# Patient Record
Sex: Male | Born: 1960
Health system: Southern US, Community
[De-identification: ages and names within clinical notes are randomized; demographics above are authoritative.]

## PROBLEM LIST (undated history)

## (undated) DIAGNOSIS — K219 Gastro-esophageal reflux disease without esophagitis: Secondary | ICD-10-CM

## (undated) DIAGNOSIS — K2270 Barrett's esophagus without dysplasia: Secondary | ICD-10-CM

## (undated) DIAGNOSIS — M1990 Unspecified osteoarthritis, unspecified site: Secondary | ICD-10-CM

## (undated) DIAGNOSIS — G629 Polyneuropathy, unspecified: Secondary | ICD-10-CM

## (undated) DIAGNOSIS — R4020 Unspecified coma: Secondary | ICD-10-CM

## (undated) DIAGNOSIS — G8929 Other chronic pain: Secondary | ICD-10-CM

## (undated) DIAGNOSIS — I10 Essential (primary) hypertension: Secondary | ICD-10-CM

## (undated) DIAGNOSIS — T17908A Unspecified foreign body in respiratory tract, part unspecified causing other injury, initial encounter: Secondary | ICD-10-CM

## (undated) DIAGNOSIS — M549 Dorsalgia, unspecified: Secondary | ICD-10-CM

## (undated) DIAGNOSIS — Z9889 Other specified postprocedural states: Secondary | ICD-10-CM

## (undated) HISTORY — PX: BACK SURGERY: SHX140

## (undated) HISTORY — PX: ESOPHAGOGASTRODUODENOSCOPY: SHX1529

## (undated) HISTORY — PX: HIP SURGERY: SHX245

## (undated) HISTORY — PX: TOTAL HIP ARTHROPLASTY: SHX124

---

## 2004-07-11 HISTORY — PX: HERNIA REPAIR: SHX51

## 2010-07-11 HISTORY — PX: HERNIA REPAIR: SHX51

## 2016-09-12 DIAGNOSIS — G8929 Other chronic pain: Secondary | ICD-10-CM | POA: Diagnosis not present

## 2016-09-12 DIAGNOSIS — M5441 Lumbago with sciatica, right side: Secondary | ICD-10-CM | POA: Diagnosis not present

## 2016-09-12 DIAGNOSIS — M961 Postlaminectomy syndrome, not elsewhere classified: Secondary | ICD-10-CM | POA: Diagnosis not present

## 2016-09-12 DIAGNOSIS — Z5181 Encounter for therapeutic drug level monitoring: Secondary | ICD-10-CM | POA: Diagnosis not present

## 2016-12-11 DIAGNOSIS — S3993XA Unspecified injury of pelvis, initial encounter: Secondary | ICD-10-CM | POA: Diagnosis not present

## 2016-12-11 DIAGNOSIS — S32010A Wedge compression fracture of first lumbar vertebra, initial encounter for closed fracture: Secondary | ICD-10-CM | POA: Diagnosis not present

## 2016-12-11 DIAGNOSIS — M545 Low back pain: Secondary | ICD-10-CM | POA: Diagnosis not present

## 2016-12-11 DIAGNOSIS — S0990XA Unspecified injury of head, initial encounter: Secondary | ICD-10-CM | POA: Diagnosis not present

## 2016-12-11 DIAGNOSIS — S3992XA Unspecified injury of lower back, initial encounter: Secondary | ICD-10-CM | POA: Diagnosis not present

## 2016-12-11 DIAGNOSIS — R102 Pelvic and perineal pain: Secondary | ICD-10-CM | POA: Diagnosis not present

## 2016-12-11 DIAGNOSIS — M542 Cervicalgia: Secondary | ICD-10-CM | POA: Diagnosis not present

## 2016-12-12 DIAGNOSIS — Z5181 Encounter for therapeutic drug level monitoring: Secondary | ICD-10-CM | POA: Diagnosis not present

## 2016-12-12 DIAGNOSIS — S32000S Wedge compression fracture of unspecified lumbar vertebra, sequela: Secondary | ICD-10-CM | POA: Diagnosis not present

## 2016-12-12 DIAGNOSIS — Z79899 Other long term (current) drug therapy: Secondary | ICD-10-CM | POA: Diagnosis not present

## 2016-12-13 DIAGNOSIS — M545 Low back pain: Secondary | ICD-10-CM | POA: Diagnosis not present

## 2016-12-13 DIAGNOSIS — G8929 Other chronic pain: Secondary | ICD-10-CM | POA: Diagnosis not present

## 2016-12-13 DIAGNOSIS — S32010G Wedge compression fracture of first lumbar vertebra, subsequent encounter for fracture with delayed healing: Secondary | ICD-10-CM | POA: Diagnosis not present

## 2016-12-13 DIAGNOSIS — S32010A Wedge compression fracture of first lumbar vertebra, initial encounter for closed fracture: Secondary | ICD-10-CM | POA: Diagnosis not present

## 2016-12-13 DIAGNOSIS — F17200 Nicotine dependence, unspecified, uncomplicated: Secondary | ICD-10-CM | POA: Diagnosis not present

## 2016-12-14 DIAGNOSIS — S32010A Wedge compression fracture of first lumbar vertebra, initial encounter for closed fracture: Secondary | ICD-10-CM | POA: Diagnosis not present

## 2016-12-14 DIAGNOSIS — M5441 Lumbago with sciatica, right side: Secondary | ICD-10-CM | POA: Diagnosis not present

## 2016-12-14 DIAGNOSIS — M5442 Lumbago with sciatica, left side: Secondary | ICD-10-CM | POA: Diagnosis not present

## 2016-12-14 DIAGNOSIS — G8929 Other chronic pain: Secondary | ICD-10-CM | POA: Diagnosis not present

## 2016-12-21 DIAGNOSIS — S32011A Stable burst fracture of first lumbar vertebra, initial encounter for closed fracture: Secondary | ICD-10-CM | POA: Diagnosis not present

## 2016-12-21 DIAGNOSIS — M545 Low back pain: Secondary | ICD-10-CM | POA: Diagnosis not present

## 2016-12-21 DIAGNOSIS — X58XXXA Exposure to other specified factors, initial encounter: Secondary | ICD-10-CM | POA: Diagnosis not present

## 2016-12-21 DIAGNOSIS — M5136 Other intervertebral disc degeneration, lumbar region: Secondary | ICD-10-CM | POA: Diagnosis not present

## 2017-01-10 ENCOUNTER — Other Ambulatory Visit: Payer: Self-pay | Admitting: Neurosurgery

## 2017-01-10 ENCOUNTER — Encounter (HOSPITAL_COMMUNITY): Payer: Self-pay | Admitting: *Deleted

## 2017-01-10 DIAGNOSIS — Z6839 Body mass index (BMI) 39.0-39.9, adult: Secondary | ICD-10-CM | POA: Diagnosis not present

## 2017-01-10 DIAGNOSIS — S32010A Wedge compression fracture of first lumbar vertebra, initial encounter for closed fracture: Secondary | ICD-10-CM | POA: Diagnosis not present

## 2017-01-10 DIAGNOSIS — I10 Essential (primary) hypertension: Secondary | ICD-10-CM | POA: Diagnosis not present

## 2017-01-12 ENCOUNTER — Inpatient Hospital Stay (HOSPITAL_COMMUNITY): Admission: RE | Admit: 2017-01-12 | Payer: Medicare Other | Source: Ambulatory Visit | Admitting: Neurosurgery

## 2017-01-12 ENCOUNTER — Encounter (HOSPITAL_COMMUNITY): Payer: Self-pay | Admitting: Certified Registered"

## 2017-01-12 HISTORY — DX: Unspecified osteoarthritis, unspecified site: M19.90

## 2017-01-12 HISTORY — DX: Gastro-esophageal reflux disease without esophagitis: K21.9

## 2017-01-12 HISTORY — DX: Polyneuropathy, unspecified: G62.9

## 2017-01-12 HISTORY — DX: Unspecified coma: R40.20

## 2017-01-12 HISTORY — DX: Barrett's esophagus without dysplasia: K22.70

## 2017-01-12 HISTORY — DX: Unspecified foreign body in respiratory tract, part unspecified causing other injury, initial encounter: T17.908A

## 2017-01-17 ENCOUNTER — Encounter (HOSPITAL_COMMUNITY): Payer: Self-pay | Admitting: *Deleted

## 2017-01-17 ENCOUNTER — Other Ambulatory Visit: Payer: Self-pay | Admitting: Neurosurgery

## 2017-01-17 NOTE — Progress Notes (Signed)
Spoke with pt's wife, Tyler AasDoris for pre-op call. Pt requested that I talk with her. She denies any cardiac history, chest pain or sob. Does have hx of HTN, but has not been on medications for a while. She states pt is not a diabetic.

## 2017-01-18 ENCOUNTER — Inpatient Hospital Stay (HOSPITAL_COMMUNITY): Payer: Medicare Other | Admitting: Certified Registered Nurse Anesthetist

## 2017-01-18 ENCOUNTER — Encounter (HOSPITAL_COMMUNITY): Payer: Self-pay | Admitting: *Deleted

## 2017-01-18 ENCOUNTER — Inpatient Hospital Stay (HOSPITAL_COMMUNITY)
Admission: RE | Disposition: A | Discharge status: Home Health | Payer: Self-pay | Source: Ambulatory Visit | Attending: Neurosurgery

## 2017-01-18 ENCOUNTER — Inpatient Hospital Stay (HOSPITAL_COMMUNITY): Payer: Medicare Other

## 2017-01-18 ENCOUNTER — Inpatient Hospital Stay (HOSPITAL_COMMUNITY)
Admission: RE | Admit: 2017-01-18 | Discharge: 2017-01-21 | DRG: 460 | Disposition: A | Discharge status: Home Health | Payer: Medicare Other | Source: Ambulatory Visit | Attending: Neurosurgery | Admitting: Neurosurgery

## 2017-01-18 DIAGNOSIS — Z8051 Family history of malignant neoplasm of kidney: Secondary | ICD-10-CM

## 2017-01-18 DIAGNOSIS — S32010A Wedge compression fracture of first lumbar vertebra, initial encounter for closed fracture: Secondary | ICD-10-CM | POA: Diagnosis present

## 2017-01-18 DIAGNOSIS — Z7982 Long term (current) use of aspirin: Secondary | ICD-10-CM

## 2017-01-18 DIAGNOSIS — Z8249 Family history of ischemic heart disease and other diseases of the circulatory system: Secondary | ICD-10-CM | POA: Diagnosis not present

## 2017-01-18 DIAGNOSIS — Z96643 Presence of artificial hip joint, bilateral: Secondary | ICD-10-CM | POA: Diagnosis present

## 2017-01-18 DIAGNOSIS — I10 Essential (primary) hypertension: Secondary | ICD-10-CM | POA: Diagnosis present

## 2017-01-18 DIAGNOSIS — Z6839 Body mass index (BMI) 39.0-39.9, adult: Secondary | ICD-10-CM | POA: Diagnosis not present

## 2017-01-18 DIAGNOSIS — F17200 Nicotine dependence, unspecified, uncomplicated: Secondary | ICD-10-CM | POA: Diagnosis not present

## 2017-01-18 DIAGNOSIS — Z419 Encounter for procedure for purposes other than remedying health state, unspecified: Secondary | ICD-10-CM

## 2017-01-18 DIAGNOSIS — S32018A Other fracture of first lumbar vertebra, initial encounter for closed fracture: Secondary | ICD-10-CM | POA: Diagnosis not present

## 2017-01-18 DIAGNOSIS — E669 Obesity, unspecified: Secondary | ICD-10-CM | POA: Diagnosis not present

## 2017-01-18 DIAGNOSIS — M4326 Fusion of spine, lumbar region: Secondary | ICD-10-CM | POA: Diagnosis not present

## 2017-01-18 DIAGNOSIS — G8929 Other chronic pain: Secondary | ICD-10-CM | POA: Diagnosis present

## 2017-01-18 DIAGNOSIS — Z833 Family history of diabetes mellitus: Secondary | ICD-10-CM

## 2017-01-18 DIAGNOSIS — Z880 Allergy status to penicillin: Secondary | ICD-10-CM

## 2017-01-18 DIAGNOSIS — M1990 Unspecified osteoarthritis, unspecified site: Secondary | ICD-10-CM | POA: Diagnosis not present

## 2017-01-18 DIAGNOSIS — Z79899 Other long term (current) drug therapy: Secondary | ICD-10-CM | POA: Diagnosis not present

## 2017-01-18 DIAGNOSIS — K219 Gastro-esophageal reflux disease without esophagitis: Secondary | ICD-10-CM | POA: Diagnosis not present

## 2017-01-18 DIAGNOSIS — S32019A Unspecified fracture of first lumbar vertebra, initial encounter for closed fracture: Secondary | ICD-10-CM | POA: Diagnosis not present

## 2017-01-18 HISTORY — DX: Dorsalgia, unspecified: M54.9

## 2017-01-18 HISTORY — DX: Other chronic pain: G89.29

## 2017-01-18 HISTORY — PX: POSTERIOR LUMBAR FUSION 4 LEVEL: SHX6037

## 2017-01-18 HISTORY — DX: Other specified postprocedural states: Z98.890

## 2017-01-18 HISTORY — DX: Essential (primary) hypertension: I10

## 2017-01-18 LAB — BASIC METABOLIC PANEL
Anion gap: 8 (ref 5–15)
BUN: 15 mg/dL (ref 6–20)
CALCIUM: 9.2 mg/dL (ref 8.9–10.3)
CO2: 22 mmol/L (ref 22–32)
CREATININE: 0.62 mg/dL (ref 0.61–1.24)
Chloride: 106 mmol/L (ref 101–111)
GFR calc Af Amer: >60 mL/min (ref >60)
GLUCOSE: 106 mg/dL — AB (ref 65–99)
POTASSIUM: 3.8 mmol/L (ref 3.5–5.1)
SODIUM: 136 mmol/L (ref 135–145)

## 2017-01-18 LAB — CBC
HCT: 47.7 % (ref 39.0–52.0)
Hemoglobin: 15.8 g/dL (ref 13.0–17.0)
MCH: 29.9 pg (ref 26.0–34.0)
MCHC: 33.1 g/dL (ref 30.0–36.0)
MCV: 90.3 fL (ref 78.0–100.0)
PLATELETS: 156 10*3/uL (ref 150–400)
RBC: 5.28 MIL/uL (ref 4.22–5.81)
RDW: 13.8 % (ref 11.5–15.5)
WBC: 8.2 10*3/uL (ref 4.0–10.5)

## 2017-01-18 LAB — SURGICAL PCR SCREEN
MRSA, PCR: NEGATIVE
Staphylococcus aureus: NEGATIVE

## 2017-01-18 LAB — TYPE AND SCREEN
ABO/RH(D): O POS
ANTIBODY SCREEN: NEGATIVE

## 2017-01-18 LAB — ABO/RH: ABO/RH(D): O POS

## 2017-01-18 SURGERY — POSTERIOR LUMBAR FUSION 4 LEVEL
Anesthesia: General

## 2017-01-18 SURGERY — POSTERIOR LUMBAR FUSION 4 LEVEL
Anesthesia: General | Site: Back

## 2017-01-18 MED ORDER — HYDROMORPHONE HCL 1 MG/ML IJ SOLN
INTRAMUSCULAR | Status: AC
  Filled 2017-01-18: qty 0.5

## 2017-01-18 MED ORDER — BUPIVACAINE HCL (PF) 0.25 % IJ SOLN
INTRAMUSCULAR | Status: AC
  Filled 2017-01-18: qty 30

## 2017-01-18 MED ORDER — LACTATED RINGERS IV SOLN
INTRAVENOUS | Status: DC
  Administered 2017-01-18 (×3): via INTRAVENOUS

## 2017-01-18 MED ORDER — SODIUM CHLORIDE 0.9% FLUSH
3.0000 mL | Freq: Two times a day (BID) | INTRAVENOUS | Status: DC
  Administered 2017-01-19 – 2017-01-21 (×5): 3 mL via INTRAVENOUS

## 2017-01-18 MED ORDER — FENTANYL CITRATE (PF) 100 MCG/2ML IJ SOLN
50.0000 ug | Freq: Once | INTRAMUSCULAR | Status: AC
  Administered 2017-01-18: 50 ug via INTRAVENOUS

## 2017-01-18 MED ORDER — KETOROLAC TROMETHAMINE 15 MG/ML IJ SOLN
15.0000 mg | Freq: Four times a day (QID) | INTRAMUSCULAR | Status: AC
  Administered 2017-01-18 – 2017-01-19 (×4): 15 mg via INTRAVENOUS
  Filled 2017-01-18 (×4): qty 1

## 2017-01-18 MED ORDER — HYDROMORPHONE HCL 1 MG/ML IJ SOLN
INTRAMUSCULAR | Status: AC
  Administered 2017-01-18: 0.5 mg via INTRAVENOUS
  Filled 2017-01-18: qty 1

## 2017-01-18 MED ORDER — GABAPENTIN 300 MG PO CAPS: 300.0000 mg | ORAL_CAPSULE | Freq: Three times a day (TID) | ORAL | Status: DC

## 2017-01-18 MED ORDER — SENNA 8.6 MG PO TABS
1.0000 | ORAL_TABLET | Freq: Two times a day (BID) | ORAL | Status: DC
  Administered 2017-01-18 – 2017-01-21 (×7): 8.6 mg via ORAL
  Filled 2017-01-18 (×7): qty 1

## 2017-01-18 MED ORDER — ONDANSETRON HCL 4 MG/2ML IJ SOLN
INTRAMUSCULAR | Status: AC
  Filled 2017-01-18: qty 2

## 2017-01-18 MED ORDER — HYDROMORPHONE HCL 1 MG/ML IJ SOLN
INTRAMUSCULAR | Status: DC | PRN
  Administered 2017-01-18 (×2): 0.5 mg via INTRAVENOUS

## 2017-01-18 MED ORDER — ACETAMINOPHEN 650 MG RE SUPP: 650.0000 mg | RECTAL | Status: DC | PRN

## 2017-01-18 MED ORDER — ADULT MULTIVITAMIN W/MINERALS CH
1.0000 | ORAL_TABLET | Freq: Every day | ORAL | Status: DC
  Administered 2017-01-19 – 2017-01-21 (×3): 1 via ORAL
  Filled 2017-01-18 (×3): qty 1

## 2017-01-18 MED ORDER — ONDANSETRON HCL 4 MG PO TABS: 4.0000 mg | ORAL_TABLET | Freq: Four times a day (QID) | ORAL | Status: DC | PRN

## 2017-01-18 MED ORDER — THROMBIN 20000 UNITS EX SOLR
CUTANEOUS | Status: DC | PRN
  Administered 2017-01-18: 18:00:00 via TOPICAL

## 2017-01-18 MED ORDER — MIDAZOLAM HCL 5 MG/5ML IJ SOLN
INTRAMUSCULAR | Status: DC | PRN
  Administered 2017-01-18: 2 mg via INTRAVENOUS

## 2017-01-18 MED ORDER — LIDOCAINE HCL (CARDIAC) 20 MG/ML IV SOLN
INTRAVENOUS | Status: AC
  Filled 2017-01-18: qty 5

## 2017-01-18 MED ORDER — LIDOCAINE 2% (20 MG/ML) 5 ML SYRINGE
INTRAMUSCULAR | Status: DC | PRN
  Administered 2017-01-18: 100 mg via INTRAVENOUS

## 2017-01-18 MED ORDER — GABAPENTIN 600 MG PO TABS
1200.0000 mg | ORAL_TABLET | Freq: Every day | ORAL | Status: DC
  Administered 2017-01-18 – 2017-01-21 (×4): 1200 mg via ORAL
  Filled 2017-01-18 (×4): qty 2

## 2017-01-18 MED ORDER — HYDROMORPHONE HCL 1 MG/ML IJ SOLN
1.0000 mg | INTRAMUSCULAR | Status: DC | PRN
  Administered 2017-01-19 – 2017-01-20 (×7): 1 mg via INTRAVENOUS
  Filled 2017-01-18 (×7): qty 1

## 2017-01-18 MED ORDER — LIDOCAINE-EPINEPHRINE 0.5 %-1:200000 IJ SOLN
INTRAMUSCULAR | Status: DC | PRN
  Administered 2017-01-18: 20 mL

## 2017-01-18 MED ORDER — FENTANYL CITRATE (PF) 250 MCG/5ML IJ SOLN
INTRAMUSCULAR | Status: AC
  Filled 2017-01-18: qty 5

## 2017-01-18 MED ORDER — GABAPENTIN 600 MG PO TABS
600.0000 mg | ORAL_TABLET | Freq: Two times a day (BID) | ORAL | Status: DC
  Administered 2017-01-19 – 2017-01-21 (×6): 600 mg via ORAL
  Filled 2017-01-18 (×6): qty 1

## 2017-01-18 MED ORDER — SENNOSIDES-DOCUSATE SODIUM 8.6-50 MG PO TABS: 1.0000 | ORAL_TABLET | Freq: Every evening | ORAL | Status: DC | PRN

## 2017-01-18 MED ORDER — CEFAZOLIN SODIUM-DEXTROSE 2-4 GM/100ML-% IV SOLN
INTRAVENOUS | Status: AC
  Filled 2017-01-18: qty 100

## 2017-01-18 MED ORDER — SODIUM CHLORIDE 0.9% FLUSH
3.0000 mL | INTRAVENOUS | Status: DC | PRN
Start: 2017-01-18 — End: 2017-01-22

## 2017-01-18 MED ORDER — OXYCODONE HCL ER 20 MG PO T12A
20.0000 mg | EXTENDED_RELEASE_TABLET | Freq: Two times a day (BID) | ORAL | Status: DC
  Administered 2017-01-18 – 2017-01-19 (×2): 20 mg via ORAL
  Filled 2017-01-18 (×2): qty 1

## 2017-01-18 MED ORDER — OMEGA-3-ACID ETHYL ESTERS 1 G PO CAPS
1.0000 g | ORAL_CAPSULE | Freq: Every day | ORAL | Status: DC
  Administered 2017-01-19 – 2017-01-21 (×3): 1 g via ORAL
  Filled 2017-01-18 (×3): qty 1

## 2017-01-18 MED ORDER — ACETAMINOPHEN 325 MG PO TABS
650.0000 mg | ORAL_TABLET | ORAL | Status: DC | PRN
  Administered 2017-01-20: 650 mg via ORAL
  Filled 2017-01-18: qty 2

## 2017-01-18 MED ORDER — PROPOFOL 10 MG/ML IV BOLUS
INTRAVENOUS | Status: AC
Start: 2017-01-18 — End: 2017-01-18
  Filled 2017-01-18: qty 20

## 2017-01-18 MED ORDER — BUPIVACAINE HCL (PF) 0.25 % IJ SOLN
INTRAMUSCULAR | Status: DC | PRN
  Administered 2017-01-18: 30 mL

## 2017-01-18 MED ORDER — OXYCODONE HCL 5 MG PO TABS: 5.0000 mg | ORAL_TABLET | Freq: Once | ORAL | Status: DC | PRN

## 2017-01-18 MED ORDER — HYDROMORPHONE HCL 1 MG/ML IJ SOLN: 0.2500 mg | INTRAMUSCULAR | Status: DC | PRN

## 2017-01-18 MED ORDER — DEXAMETHASONE SODIUM PHOSPHATE 10 MG/ML IJ SOLN
INTRAMUSCULAR | Status: AC
  Filled 2017-01-18: qty 1

## 2017-01-18 MED ORDER — PHENOL 1.4 % MT LIQD: 1.0000 | OROMUCOSAL | Status: DC | PRN

## 2017-01-18 MED ORDER — GABAPENTIN 600 MG PO TABS: 600.0000 mg | ORAL_TABLET | Freq: Three times a day (TID) | ORAL | Status: DC

## 2017-01-18 MED ORDER — ZOLPIDEM TARTRATE 5 MG PO TABS
5.0000 mg | ORAL_TABLET | Freq: Every evening | ORAL | Status: DC | PRN
  Administered 2017-01-19 – 2017-01-21 (×3): 5 mg via ORAL
  Filled 2017-01-18 (×3): qty 1

## 2017-01-18 MED ORDER — BISACODYL 5 MG PO TBEC: 5.0000 mg | DELAYED_RELEASE_TABLET | Freq: Every day | ORAL | Status: DC | PRN

## 2017-01-18 MED ORDER — HYDROMORPHONE HCL 1 MG/ML IJ SOLN
INTRAMUSCULAR | Status: AC
  Administered 2017-01-18: 0.5 mg via INTRAVENOUS
  Filled 2017-01-18: qty 0.5

## 2017-01-18 MED ORDER — ONDANSETRON HCL 4 MG/2ML IJ SOLN
INTRAMUSCULAR | Status: DC | PRN
  Administered 2017-01-18 (×2): 4 mg via INTRAVENOUS

## 2017-01-18 MED ORDER — MAGNESIUM CITRATE PO SOLN: 1.0000 | Freq: Once | ORAL | Status: DC | PRN

## 2017-01-18 MED ORDER — ROCURONIUM BROMIDE 10 MG/ML (PF) SYRINGE
PREFILLED_SYRINGE | INTRAVENOUS | Status: DC | PRN
  Administered 2017-01-18: 50 mg via INTRAVENOUS

## 2017-01-18 MED ORDER — PHENYLEPHRINE HCL 10 MG/ML IJ SOLN
INTRAVENOUS | Status: DC | PRN
  Administered 2017-01-18: 25 ug/min via INTRAVENOUS

## 2017-01-18 MED ORDER — MIDAZOLAM HCL 2 MG/2ML IJ SOLN
INTRAMUSCULAR | Status: AC
  Filled 2017-01-18: qty 2

## 2017-01-18 MED ORDER — DIAZEPAM 5 MG PO TABS
5.0000 mg | ORAL_TABLET | Freq: Four times a day (QID) | ORAL | Status: DC | PRN
  Administered 2017-01-19 – 2017-01-21 (×6): 5 mg via ORAL
  Filled 2017-01-18 (×6): qty 1

## 2017-01-18 MED ORDER — ALUM & MAG HYDROXIDE-SIMETH 200-200-20 MG/5ML PO SUSP
30.0000 mL | Freq: Four times a day (QID) | ORAL | Status: DC | PRN
  Administered 2017-01-20: 30 mL via ORAL
  Filled 2017-01-18: qty 30

## 2017-01-18 MED ORDER — THROMBIN 20000 UNITS EX SOLR
CUTANEOUS | Status: AC
  Filled 2017-01-18: qty 20000

## 2017-01-18 MED ORDER — MUPIROCIN 2 % EX OINT
1.0000 "application " | TOPICAL_OINTMENT | Freq: Once | CUTANEOUS | Status: AC
  Administered 2017-01-18: 1 via TOPICAL
  Filled 2017-01-18: qty 22

## 2017-01-18 MED ORDER — SODIUM CHLORIDE 0.9 % IV SOLN: 250.0000 mL | INTRAVENOUS | Status: DC

## 2017-01-18 MED ORDER — MENTHOL 3 MG MT LOZG: 1.0000 | LOZENGE | OROMUCOSAL | Status: DC | PRN

## 2017-01-18 MED ORDER — FENTANYL CITRATE (PF) 100 MCG/2ML IJ SOLN
INTRAMUSCULAR | Status: AC
  Filled 2017-01-18: qty 2

## 2017-01-18 MED ORDER — SODIUM CHLORIDE 0.9 % IV SOLN
INTRAVENOUS | Status: DC | PRN
  Administered 2017-01-18: 19:00:00 via INTRAVENOUS

## 2017-01-18 MED ORDER — OXYCODONE HCL 5 MG/5ML PO SOLN
5.0000 mg | Freq: Once | ORAL | Status: DC | PRN
Start: 2017-01-18 — End: 2017-01-18

## 2017-01-18 MED ORDER — OXYCODONE HCL 5 MG PO TABS
5.0000 mg | ORAL_TABLET | ORAL | Status: DC | PRN
  Administered 2017-01-18 – 2017-01-21 (×11): 10 mg via ORAL
  Administered 2017-01-21: 5 mg via ORAL
  Administered 2017-01-21: 10 mg via ORAL
  Filled 2017-01-18 (×13): qty 2

## 2017-01-18 MED ORDER — SUGAMMADEX SODIUM 200 MG/2ML IV SOLN
INTRAVENOUS | Status: DC | PRN
  Administered 2017-01-18: 200 mg via INTRAVENOUS

## 2017-01-18 MED ORDER — PHENYLEPHRINE 40 MCG/ML (10ML) SYRINGE FOR IV PUSH (FOR BLOOD PRESSURE SUPPORT)
PREFILLED_SYRINGE | INTRAVENOUS | Status: DC | PRN
  Administered 2017-01-18 (×3): 80 ug via INTRAVENOUS

## 2017-01-18 MED ORDER — LIDOCAINE-EPINEPHRINE 0.5 %-1:200000 IJ SOLN
INTRAMUSCULAR | Status: AC
  Filled 2017-01-18: qty 1

## 2017-01-18 MED ORDER — ONDANSETRON HCL 4 MG/2ML IJ SOLN
4.0000 mg | Freq: Four times a day (QID) | INTRAMUSCULAR | Status: DC | PRN
Start: 2017-01-18 — End: 2017-01-22

## 2017-01-18 MED ORDER — ONDANSETRON HCL 4 MG/2ML IJ SOLN
4.0000 mg | Freq: Once | INTRAMUSCULAR | Status: DC | PRN
Start: 2017-01-18 — End: 2017-01-18

## 2017-01-18 MED ORDER — CEFAZOLIN SODIUM-DEXTROSE 2-3 GM-% IV SOLR
2.0000 g | Freq: Once | INTRAVENOUS | Status: AC
  Administered 2017-01-18 (×2): 2 g via INTRAVENOUS
  Filled 2017-01-18: qty 50

## 2017-01-18 MED ORDER — PANTOPRAZOLE SODIUM 40 MG PO TBEC
80.0000 mg | DELAYED_RELEASE_TABLET | Freq: Every day | ORAL | Status: DC
  Administered 2017-01-19 – 2017-01-21 (×3): 80 mg via ORAL
  Filled 2017-01-18 (×3): qty 2

## 2017-01-18 MED ORDER — VECURONIUM BROMIDE 10 MG IV SOLR
INTRAVENOUS | Status: AC
  Filled 2017-01-18: qty 10

## 2017-01-18 MED ORDER — SODIUM CHLORIDE 0.9 % IJ SOLN
INTRAMUSCULAR | Status: AC
  Filled 2017-01-18: qty 10

## 2017-01-18 MED ORDER — POTASSIUM CHLORIDE IN NACL 20-0.9 MEQ/L-% IV SOLN
INTRAVENOUS | Status: DC
  Administered 2017-01-18: 23:00:00 via INTRAVENOUS
  Filled 2017-01-18 (×2): qty 1000

## 2017-01-18 MED ORDER — PHENYLEPHRINE 40 MCG/ML (10ML) SYRINGE FOR IV PUSH (FOR BLOOD PRESSURE SUPPORT)
PREFILLED_SYRINGE | INTRAVENOUS | Status: AC
  Filled 2017-01-18: qty 10

## 2017-01-18 MED ORDER — PROPOFOL 10 MG/ML IV BOLUS
INTRAVENOUS | Status: DC | PRN
  Administered 2017-01-18: 180 mg via INTRAVENOUS

## 2017-01-18 MED ORDER — VECURONIUM BROMIDE 10 MG IV SOLR
INTRAVENOUS | Status: DC | PRN
  Administered 2017-01-18: 2 mg via INTRAVENOUS
  Administered 2017-01-18 (×2): 3 mg via INTRAVENOUS
  Administered 2017-01-18 (×2): 1 mg via INTRAVENOUS

## 2017-01-18 MED ORDER — FENTANYL CITRATE (PF) 100 MCG/2ML IJ SOLN
INTRAMUSCULAR | Status: DC | PRN
  Administered 2017-01-18: 50 ug via INTRAVENOUS
  Administered 2017-01-18: 100 ug via INTRAVENOUS
  Administered 2017-01-18: 50 ug via INTRAVENOUS
  Administered 2017-01-18 (×2): 100 ug via INTRAVENOUS
  Administered 2017-01-18 (×3): 50 ug via INTRAVENOUS
  Administered 2017-01-18: 100 ug via INTRAVENOUS
  Administered 2017-01-18 (×2): 50 ug via INTRAVENOUS

## 2017-01-18 MED ORDER — HYDROMORPHONE HCL 1 MG/ML IJ SOLN
0.2500 mg | INTRAMUSCULAR | Status: DC | PRN
  Administered 2017-01-18 (×4): 0.5 mg via INTRAVENOUS

## 2017-01-18 MED ORDER — ROCURONIUM BROMIDE 50 MG/5ML IV SOLN
INTRAVENOUS | Status: AC
  Filled 2017-01-18: qty 1

## 2017-01-18 MED ORDER — CEFAZOLIN SODIUM 1 G IJ SOLR
INTRAMUSCULAR | Status: AC
  Filled 2017-01-18: qty 20

## 2017-01-18 MED ORDER — 0.9 % SODIUM CHLORIDE (POUR BTL) OPTIME
TOPICAL | Status: DC | PRN
  Administered 2017-01-18: 1000 mL

## 2017-01-18 SURGICAL SUPPLY — 72 items
BAG DECANTER FOR FLEXI CONT (MISCELLANEOUS) IMPLANT
BENZOIN TINCTURE PRP APPL 2/3 (GAUZE/BANDAGES/DRESSINGS) IMPLANT
BLADE CLIPPER SURG (BLADE) IMPLANT
BUR MATCHSTICK NEURO 3.0 LAGG (BURR) ×2 IMPLANT
BUR PRECISION FLUTE 5.0 (BURR) ×2 IMPLANT
CANISTER SUCT 3000ML PPV (MISCELLANEOUS) ×2 IMPLANT
CARTRIDGE OIL MAESTRO DRILL (MISCELLANEOUS) ×1 IMPLANT
CONT SPEC 4OZ CLIKSEAL STRL BL (MISCELLANEOUS) ×2 IMPLANT
COVER BACK TABLE 60X90IN (DRAPES) IMPLANT
DECANTER SPIKE VIAL GLASS SM (MISCELLANEOUS) ×2 IMPLANT
DERMABOND ADVANCED (GAUZE/BANDAGES/DRESSINGS) ×3
DERMABOND ADVANCED .7 DNX12 (GAUZE/BANDAGES/DRESSINGS) ×3 IMPLANT
DIFFUSER DRILL AIR PNEUMATIC (MISCELLANEOUS) ×2 IMPLANT
DRAPE C-ARM 42X72 X-RAY (DRAPES) ×2 IMPLANT
DRAPE C-ARMOR (DRAPES) ×2 IMPLANT
DRAPE LAPAROTOMY 100X72X124 (DRAPES) ×2 IMPLANT
DRAPE POUCH INSTRU U-SHP 10X18 (DRAPES) ×2 IMPLANT
DRAPE SURG 17X23 STRL (DRAPES) ×2 IMPLANT
DRSG MEPILEX BORDER 4X12 (GAUZE/BANDAGES/DRESSINGS) ×2 IMPLANT
DURAPREP 26ML APPLICATOR (WOUND CARE) ×2 IMPLANT
ELECT BLADE 4.0 EZ CLEAN MEGAD (MISCELLANEOUS) ×2
ELECT REM PT RETURN 9FT ADLT (ELECTROSURGICAL) ×2
ELECTRODE BLDE 4.0 EZ CLN MEGD (MISCELLANEOUS) ×1 IMPLANT
ELECTRODE REM PT RTRN 9FT ADLT (ELECTROSURGICAL) ×1 IMPLANT
GAUZE SPONGE 4X4 12PLY STRL (GAUZE/BANDAGES/DRESSINGS) IMPLANT
GAUZE SPONGE 4X4 16PLY XRAY LF (GAUZE/BANDAGES/DRESSINGS) ×6 IMPLANT
GLOVE BIO SURGEON STRL SZ 6.5 (GLOVE) ×2 IMPLANT
GLOVE BIO SURGEON STRL SZ7 (GLOVE) ×2 IMPLANT
GLOVE BIOGEL PI IND STRL 7.5 (GLOVE) ×1 IMPLANT
GLOVE BIOGEL PI IND STRL 8 (GLOVE) ×1 IMPLANT
GLOVE BIOGEL PI INDICATOR 7.5 (GLOVE) ×1
GLOVE BIOGEL PI INDICATOR 8 (GLOVE) ×1
GLOVE ECLIPSE 6.5 STRL STRAW (GLOVE) ×4 IMPLANT
GLOVE ECLIPSE 7.5 STRL STRAW (GLOVE) ×2 IMPLANT
GLOVE EXAM NITRILE LRG STRL (GLOVE) IMPLANT
GLOVE EXAM NITRILE XL STR (GLOVE) IMPLANT
GLOVE EXAM NITRILE XS STR PU (GLOVE) IMPLANT
GLOVE SURG SS PI 7.0 STRL IVOR (GLOVE) ×6 IMPLANT
GLOVE SURG SS PI 7.5 STRL IVOR (GLOVE) ×2 IMPLANT
GOWN STRL REUS W/ TWL LRG LVL3 (GOWN DISPOSABLE) ×5 IMPLANT
GOWN STRL REUS W/ TWL XL LVL3 (GOWN DISPOSABLE) ×1 IMPLANT
GOWN STRL REUS W/TWL 2XL LVL3 (GOWN DISPOSABLE) IMPLANT
GOWN STRL REUS W/TWL LRG LVL3 (GOWN DISPOSABLE) ×5
GOWN STRL REUS W/TWL XL LVL3 (GOWN DISPOSABLE) ×1
GRAFT BN 10X1XDBM MAGNIFUSE (Bone Implant) ×2 IMPLANT
GRAFT BONE MAGNIFUSE 1X10CM (Bone Implant) ×2 IMPLANT
KIT BASIN OR (CUSTOM PROCEDURE TRAY) ×2 IMPLANT
KIT POSITION SURG JACKSON T1 (MISCELLANEOUS) ×2 IMPLANT
KIT ROOM TURNOVER OR (KITS) ×2 IMPLANT
NEEDLE HYPO 25X1 1.5 SAFETY (NEEDLE) ×2 IMPLANT
NEEDLE SPNL 18GX3.5 QUINCKE PK (NEEDLE) ×2 IMPLANT
NS IRRIG 1000ML POUR BTL (IV SOLUTION) ×2 IMPLANT
OIL CARTRIDGE MAESTRO DRILL (MISCELLANEOUS) ×2
PACK LAMINECTOMY NEURO (CUSTOM PROCEDURE TRAY) ×2 IMPLANT
PAD ARMBOARD 7.5X6 YLW CONV (MISCELLANEOUS) ×6 IMPLANT
ROD RELINE-O 5.5X300 STRT NS (Rod) ×1 IMPLANT
ROD RELINE-O 5.5X300MM STRT (Rod) ×1 IMPLANT
SCREW LOCK RELINE 5.5 TULIP (Screw) ×16 IMPLANT
SCREW RELINE-O 6.5X55 POLY (Screw) ×2 IMPLANT
SCREW RELINE-O POLY 5.5X45MM (Screw) ×8 IMPLANT
SCREW RELINE-O POLY 6.5X45 (Screw) ×8 IMPLANT
SPONGE LAP 4X18 X RAY DECT (DISPOSABLE) IMPLANT
SPONGE SURGIFOAM ABS GEL 100 (HEMOSTASIS) ×2 IMPLANT
STRIP CLOSURE SKIN 1/2X4 (GAUZE/BANDAGES/DRESSINGS) IMPLANT
SUT PROLENE 6 0 BV (SUTURE) IMPLANT
SUT VIC AB 0 CT1 18XCR BRD8 (SUTURE) ×3 IMPLANT
SUT VIC AB 0 CT1 8-18 (SUTURE) ×3
SUT VIC AB 2-0 CT1 18 (SUTURE) ×4 IMPLANT
SUT VIC AB 3-0 SH 8-18 (SUTURE) ×6 IMPLANT
TOWEL GREEN STERILE (TOWEL DISPOSABLE) ×2 IMPLANT
TOWEL GREEN STERILE FF (TOWEL DISPOSABLE) ×2 IMPLANT
WATER STERILE IRR 1000ML POUR (IV SOLUTION) ×2 IMPLANT
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
## 2017-01-18 NOTE — Op Note (Signed)
01/18/2017  8:52 PM  PATIENT:  Sean MuldersBilly Charles Crew Jr.  56 y.o. male with a compression fracture at L1, which has progressively collapsed over the last month. I recommended he undergo operative fixation to stabilize the spine.   PRE-OPERATIVE DIAGNOSIS:  Closed compression fracture of lumbar vertebra L1  POST-OPERATIVE DIAGNOSIS:  Closed compression fracture of lumbar vertebra L1  PROCEDURE:  Procedure(s): Thoracic eleven t0 Lumbar three Posterior lateral fusion, allograft morsels Segmental pedicle screw fixation T11-L3 nuvasive relign     SURGEON: Surgeon(s): Coletta Memosabbell, Kassandra Meriweather, MD Shirlean KellyNudelman, Robert, MD  ASSISTANTS:Nudelman, Molly Madurorobert  ANESTHESIA:   general  EBL:  Total I/O In: 1325 [I.V.:1250; Blood:75] Out: 250 [Urine:50; Blood:200]  BLOOD ADMINISTERED:250 CC CELLSAVER  CELL SAVER GIVEN:yes  COUNT:per nursing  DRAINS: (1) Hemovact drain(s) in the subfascial with  Suction Open   SPECIMEN:  No Specimen  DICTATION: Sean MuldersBilly Charles Saravia Jr. was taken to the operating room, intubated, and placed under a general anesthesia without difficulty. He was positioned prone on the LillyJackson table with all pressure points properly padded. His thoracolumbar region  was prepped and draped in a sterile manner. I infiltrated the planned incision with lidocaine. I opened the skin with a 10 blade and carried the incision down to the thoracolumbar fascia. I exposed the spine from T10 to L3 bilaterally.  With fluoroscopic guidance I placed pedicle screws with Dr. Earl GalaNudelman's assistance at T11,12, L2, 3 bilaterally. An entrance site was drilled for each screw, then the pedicle was sounded with a probe, then the hole was tapped with a 5.575mm tap. After assessing the integrity of each hole pedicle screws were placed without difficulty. Once all the screws were placed rods were attached with locking caps. I placed allograft along the lamina from T11 to L3 to complete the arthrodesis.  I then irrigated, took final  films, and started the closure. I closed the wound in layers using vicryl sutures approximating the thoracolumbar fascia, subcutaneous, and subcuticular layers. I did place a drain in the subfascial space and brought it out through the skin and attached to a closed drainage system. He was extubated and taken to the pacu.  I applied a sterile dressing.   PLAN OF CARE: Admit to inpatient   PATIENT DISPOSITION:  PACU - hemodynamically stable.   Delay start of Pharmacological VTE agent (>24hrs) due to surgical blood loss or risk of bleeding:  yes

## 2017-01-18 NOTE — Transfer of Care (Signed)
Immediate Anesthesia Transfer of Care Note  Patient: Sean MuldersBilly Charles Casimir Jr.  Procedure(s) Performed: Procedure(s): Thoracic eleven t0 Lumbar three Posterior lateral fusion (N/A)  Patient Location: PACU  Anesthesia Type:General  Level of Consciousness: awake, alert  and oriented  Airway & Oxygen Therapy: Patient Spontanous Breathing and Patient connected to nasal cannula oxygen  Post-op Assessment: Report given to RN and Post -op Vital signs reviewed and stable  Post vital signs: Reviewed and stable  Last Vitals:  Vitals:   01/18/17 1136 01/18/17 2102  BP: (!) 158/99   Pulse: 74   Resp: 18   Temp: 36.8 C (P) 36.5 C    Last Pain:  Vitals:   01/18/17 2102  TempSrc:   PainSc: (P) 7       Patients Stated Pain Goal: 4 (01/18/17 1444)  Complications: No apparent anesthesia complications

## 2017-01-18 NOTE — Anesthesia Preprocedure Evaluation (Addendum)
Anesthesia Evaluation  Patient identified by MRN, date of birth, ID band Patient awake    Reviewed: Allergy & Precautions, H&P , NPO status , Patient's Chart, lab work & pertinent test results  Airway Mallampati: II  TM Distance: >3 FB Neck ROM: Full    Dental no notable dental hx. (+) Edentulous Upper, Dental Advisory Given   Pulmonary Current Smoker,    Pulmonary exam normal breath sounds clear to auscultation       Cardiovascular hypertension, Pt. on medications  Rhythm:Regular Rate:Normal     Neuro/Psych negative neurological ROS  negative psych ROS   GI/Hepatic Neg liver ROS, GERD  Medicated and Controlled,  Endo/Other  negative endocrine ROS  Renal/GU negative Renal ROS  negative genitourinary   Musculoskeletal  (+) Arthritis , Osteoarthritis,    Abdominal (+) + obese,   Peds  Hematology negative hematology ROS (+)   Anesthesia Other Findings   Reproductive/Obstetrics negative OB ROS                            Anesthesia Physical Anesthesia Plan  ASA: III  Anesthesia Plan: General   Post-op Pain Management:    Induction: Intravenous  PONV Risk Score and Plan: Dexamethasone and Ondansetron  Airway Management Planned: Oral ETT  Additional Equipment:   Intra-op Plan:   Post-operative Plan: Extubation in OR  Informed Consent: I have reviewed the patients History and Physical, chart, labs and discussed the procedure including the risks, benefits and alternatives for the proposed anesthesia with the patient or authorized representative who has indicated his/her understanding and acceptance.   Dental advisory given  Plan Discussed with: CRNA  Anesthesia Plan Comments:        Anesthesia Quick Evaluation

## 2017-01-18 NOTE — Anesthesia Procedure Notes (Signed)
Procedure Name: Intubation Date/Time: 01/18/2017 4:23 PM Performed by: Rush Farmer E Pre-anesthesia Checklist: Patient identified, Emergency Drugs available, Suction available and Patient being monitored Patient Re-evaluated:Patient Re-evaluated prior to induction Oxygen Delivery Method: Circle system utilized Preoxygenation: Pre-oxygenation with 100% oxygen Induction Type: IV induction Ventilation: Mask ventilation without difficulty Laryngoscope Size: Mac and 4 Grade View: Grade I Tube type: Oral Tube size: 7.5 mm Number of attempts: 1 Airway Equipment and Method: Stylet Placement Confirmation: ETT inserted through vocal cords under direct vision,  positive ETCO2 and breath sounds checked- equal and bilateral Secured at: 19 cm Tube secured with: Tape Dental Injury: Teeth and Oropharynx as per pre-operative assessment

## 2017-01-18 NOTE — Anesthesia Postprocedure Evaluation (Signed)
Anesthesia Post Note  Patient: Sean MuldersBilly Charles Chokshi Jr.  Procedure(s) Performed: Procedure(s) (LRB): Thoracic eleven t0 Lumbar three Posterior lateral fusion (N/A)     Patient location during evaluation: PACU Anesthesia Type: General Level of consciousness: sedated and patient cooperative Pain management: pain level controlled Vital Signs Assessment: post-procedure vital signs reviewed and stable Respiratory status: spontaneous breathing Cardiovascular status: stable Anesthetic complications: no    Last Vitals:  Vitals:   01/18/17 2200 01/18/17 2225  BP: (!) 113/91 128/74  Pulse: 95 99  Resp: 15 16  Temp:  37.2 C    Last Pain:  Vitals:   01/18/17 2225  TempSrc: Oral  PainSc:                  Sean Barber

## 2017-01-19 ENCOUNTER — Encounter (HOSPITAL_COMMUNITY): Payer: Self-pay | Admitting: Neurosurgery

## 2017-01-19 MED ORDER — OXYCODONE HCL ER 15 MG PO T12A
30.0000 mg | EXTENDED_RELEASE_TABLET | Freq: Two times a day (BID) | ORAL | Status: DC
  Administered 2017-01-19 – 2017-01-21 (×5): 30 mg via ORAL
  Filled 2017-01-19 (×5): qty 2

## 2017-01-19 MED FILL — Heparin Sodium (Porcine) Inj 1000 Unit/ML: INTRAMUSCULAR | Qty: 30 | Status: AC

## 2017-01-19 MED FILL — Sodium Chloride IV Soln 0.9%: INTRAVENOUS | Qty: 2000 | Status: AC

## 2017-01-19 NOTE — Progress Notes (Signed)
Pt. with sudden onset of urinary incontinence. Pt. Has been voiding in the urinal all day but now complains that it's been burning each time. Pt. Did not know he had voided in the bed. It appears to be a large amount. Bladder Scan showing 0 to 25cc. Sensation checked and patient does have numbness in the right thigh but he states he had this pre-op.   Dr. Yetta BarreJones notified, no further orders received. Will continue to monitor.
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
## 2017-01-19 NOTE — Progress Notes (Signed)
Orthopedic Tech Progress Note Patient Details:  Sean MuldersBilly Charles Cumbo Jr. Jun 04, 1961 [id number]  Patient ID: Sean MuldersBilly Charles Dao Jr., male   DOB: Jun 04, 1961, 56 y.o.   MRN: [id number]   Nikki DomCrawford, Aliceson Dolbow 01/19/2017, 9:30 AM Called in bio-tech brace order; [facility]

## 2017-01-19 NOTE — Progress Notes (Signed)
Pt. Went to bathroom and stated he voided. Pt. With inconsistent statements regarding sensation in pelvic area. Also c/o lower abd pain. Bladder Scan still showing 0cc. Due to continued concerns of urine retention, in and out cath completed, only 10cc out, positive sensation during this.

## 2017-01-19 NOTE — Progress Notes (Signed)
Patient ID: Sean MuldersBilly Charles Boggess Jr., male   DOB: 1960-10-24, 56 y.o.   MRN: [id number] BP 119/68 (BP Location: Right Arm)   Pulse 90   Temp 98.7 F (37.1 C) (Oral)   Resp 18   Ht 5\' 7"  (1.702 m)   Wt 114.8 kg (253 lb)   SpO2 96%   BMI 39.63 kg/m  Alert and oriented x 4 Moving lower extremities well Wound dressing is clean,blood stained, dry Making slow progress

## 2017-01-20 MED ORDER — CELECOXIB 200 MG PO CAPS
200.0000 mg | ORAL_CAPSULE | Freq: Two times a day (BID) | ORAL | Status: DC
  Administered 2017-01-20 – 2017-01-21 (×3): 200 mg via ORAL
  Filled 2017-01-20 (×3): qty 1

## 2017-01-20 NOTE — Progress Notes (Signed)
Pt. Continues to be limited on mobility due to pain. Dr. Franky Machoabbell is aware of all ordered medications being used with minimal relief per patient. Pt. Will not lay down in the bed, continuing to sit at a 65 degree angle or on the side of bed, not meeting back precautions. Ambulated in hall x1 today.

## 2017-01-20 NOTE — Progress Notes (Signed)
Patient ID: Sean MuldersBilly Charles Hiegel Jr., male   DOB: [birth year], 56 y.o.   MRN: [id number] BP 124/76 (BP Location: Right Arm)   Pulse 99   Temp 98.3 F (36.8 C) (Oral)   Resp 18   Ht 5\' 7"  (1.702 m)   Wt 114.8 kg (253 lb)   SpO2 97%   BMI 39.63 kg/m  Alert and oriented x 4, speech is clear and fluent Moving lower extremities with full strength Claims bladder incontinence, but has normal bowel movements, normal strength, and no bladder residuals Will monitor.

## 2017-01-20 NOTE — Progress Notes (Signed)
Patient had an episode of incontinence while asleep. Stated less burning while voiding.
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
## 2017-01-20 NOTE — Care Management Note (Signed)
Case Management Note  Patient Details  Name: Sean MuldersBilly Charles Mauceri Jr. MRN: [id number] Date of Birth: 07/31/1960  Subjective/Objective:     Pt s/p  Thoracic eleven t0 Lumbar three Posterior lateral fusion. He is from home with his spouse.              Action/Plan: PT recommending HH services. No f/u per OT. CM following for d/c needs, physician orders.  Expected Discharge Date:                  Expected Discharge Plan:  Home w Home Health Services  In-House Referral:     Discharge planning Services  CM Consult  Post Acute Care Choice:    Choice offered to:     DME Arranged:    DME Agency:     HH Arranged:    HH Agency:     Status of Service:  In process, will continue to follow  If discussed at Long Length of Stay Meetings, dates discussed:    Additional Comments:  Kermit BaloKelli F Kessa Fairbairn, RN 01/20/2017, 11:09 AM

## 2017-01-20 NOTE — Progress Notes (Signed)
Pt. Remains non compliant with safety interventions. Only wants to go to bathroom with wife, not with staff. Does not call for help at all and refuses bed alarm. Pt. Is 1 person standby assist. Chrissie NoaWilliam, AD made aware.
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
## 2017-01-21 MED ORDER — DIAZEPAM 5 MG PO TABS: 5.0000 mg | ORAL_TABLET | Freq: Four times a day (QID) | ORAL | 0 refills | Status: DC | PRN

## 2017-01-21 MED ORDER — OXYCODONE HCL ER 30 MG PO T12A: 30.0000 mg | EXTENDED_RELEASE_TABLET | Freq: Two times a day (BID) | ORAL | 0 refills | Status: DC

## 2017-01-21 MED ORDER — OXYCODONE HCL 5 MG PO TABS: 5.0000 mg | ORAL_TABLET | ORAL | 0 refills | Status: DC | PRN

## 2017-01-21 NOTE — Progress Notes (Signed)
Pt seen and examined.  No issues overnight. Pain significantly improved from yesterday Denies incontinence issues  No concerns today  EXAM: Temp:  [98 F (36.7 C)-99.4 F (37.4 C)] 98 F (36.7 C) (07/14 0547) Pulse Rate:  [83-100] 94 (07/14 0547) Resp:  [18-20] 20 (07/14 0547) BP: (115-136)/(66-78) 118/73 (07/14 0547) SpO2:  [95 %-97 %] 96 % (07/14 0547) Intake/Output    None    Awake and alert Follows commands throughout Full strength Wound c/d/i  Plan Progressing nicely Pain well controlled Ambulating in halls Pt would like to go home possibly today. I believe this is okay based on his progress, pain control, ambulation. He will think about it. Discharge orders signed if he chooses to go home this afternoon. Otherwise, will see him tomorrow.

## 2017-01-21 NOTE — Discharge Summary (Signed)
Physician Discharge Summary  Patient ID: Sean Barber. MRN: [id number] DOB/AGE: 1961-01-16 56 y.o.  Admit date: 01/18/2017 Discharge date: 01/21/2017  Admission Diagnoses:  Traumatic compression fracture L1  Discharge Diagnoses:  Same Active Problems:   Traumatic compression fracture of L1 lumbar vertebra [facility])  Discharged Condition: Stable  Hospital Course:  Sean Barber. is a 56 y.o. male who was admitted for the below procedure. There were no post operative complications. At time of discharge, pain was well controlled, ambulating with Pt/OT, tolerating po, voiding normal. Ready for discharge. PT rec HH. Agree with this. Order placed. 3in1 rec by OT. Order placed. Discussed pain control for discharge. He has been on oxy 20 ER x several years for chronic pain. Will adjust meds temporarily due to surgery. He will call Monday for post op appt.   Treatments: Surgery - Thoracic eleven t0 Lumbar three Posterior lateral fusion, allograft morsels Segmental pedicle screw fixation T11-L3 nuvasive relign  Discharge Exam: Blood pressure 118/73, pulse 94, temperature 98 F (36.7 C), temperature source Oral, resp. rate 20, height 5\' 7"  (1.702 m), weight 114.8 kg (253 lb), SpO2 96 %. Awake, alert, oriented Speech fluent, appropriate CN grossly intact 5/5 BUE/BLE Wound c/d/i  Disposition: Final discharge disposition not confirmed  Discharge Instructions    Call MD for:  difficulty breathing, headache or visual disturbances    Complete by:  As directed    Call MD for:  persistant dizziness or light-headedness    Complete by:  As directed    Call MD for:  redness, tenderness, or signs of infection (pain, swelling, redness, odor or green/yellow discharge around incision site)    Complete by:  As directed    Call MD for:  severe uncontrolled pain    Complete by:  As directed    Call MD for:  temperature >100.4    Complete by:  As directed    Diet general    Complete by:   As directed    Driving Restrictions    Complete by:  As directed    Do not drive until given clearance.   Increase activity slowly    Complete by:  As directed    Lifting restrictions    Complete by:  As directed    Do not lift anything >10lbs. Avoid bending and twisting in awkward positions. Avoid bending at the back.     Allergies as of 01/21/2017      Reactions   Penicillins Other (See Comments)   UNKNOWN REACTION Has patient had a PCN reaction causing immediate rash, facial/tongue/throat swelling, SOB or lightheadedness with hypotension: Unknown Has patient had a PCN reaction causing severe rash involving mucus membranes or skin necrosis: No Has patient had a PCN reaction that required hospitalization: No Has patient had a PCN reaction occurring within the last 10 years:No If all of the above answers are "NO", then may proceed with Cephalosporin use.      Medication List    STOP taking these medications   OXYCONTIN 20 mg 12 hr tablet Generic drug:  oxyCODONE Replaced by:  oxyCODONE 5 MG immediate release tablet     TAKE these medications   aspirin 325 MG EC tablet Take 325 mg by mouth daily.   diazepam 5 MG tablet Commonly known as:  VALIUM Take 1 tablet (5 mg total) by mouth every 6 (six) hours as needed for muscle spasms.   etodolac 500 MG tablet Commonly known as:  LODINE Take 500 mg by mouth  2 (two) times daily.   gabapentin 600 MG tablet Commonly known as:  NEURONTIN Take 600-1,200 mg by mouth 3 (three) times daily. 600 mg in AM and Afternoon, 1200 mg at bedtime   Garlic 1000 MG Caps Take 1,000 mg by mouth daily.   multivitamin with minerals Tabs tablet Take 1 tablet by mouth daily.   omega-3 acid ethyl esters 1 g capsule Commonly known as:  LOVAZA Take 1 g by mouth daily.   omeprazole 40 MG capsule Commonly known as:  PRILOSEC Take 40 mg by mouth 2 (two) times daily.   oxyCODONE 5 MG immediate release tablet Commonly known as:  Oxy  IR/ROXICODONE Take 1-2 tablets (5-10 mg total) by mouth every 3 (three) hours as needed for breakthrough pain. Replaces:  OXYCONTIN 20 mg 12 hr tablet   oxyCODONE 30 MG 12 hr tablet Commonly known as:  OXYCONTIN Take 30 mg by mouth 2 (two) times daily.            Durable Medical Equipment        Start     Ordered   01/21/17 0745  DME 3-in-1  Once     01/21/17 0744       Signed: Alyson InglesCOSTELLA, Jailin Moomaw J 01/21/2017, 7:53 AM
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
## 2017-01-21 NOTE — Progress Notes (Signed)
Patient continues to be non compliant with safety measures.  Refuses to allow staff members to assist him to the bathroom, only wants his wife to help him to the bathroom.

## 2017-01-21 NOTE — Progress Notes (Signed)
Patient discharged home with all belongings accompanied by family. All discharge instructions given.

## 2017-01-21 NOTE — Care Management Note (Signed)
Case Management Note  Patient Details  Name: Vanetta MuldersBilly Charles Stabler Jr. MRN: [id number] Date of Birth: [birth year]  Subjective/Objective:                 Spoke with patient and wife. They would like 3/1, [facility]. Wife would like to use any HH company that will take insurance and service their area. [place] [facility] referral for [facility].   Action/Plan:  DC to home a HH PT and 3/1.  Expected Discharge Date:  01/21/17               Expected Discharge Plan:  Home w Home Health Services  In-House Referral:     Discharge planning Services  CM Consult  Post Acute Care Choice:  Durable Medical Equipment, Home Health Choice offered to:  Patient  DME Arranged:  3-N-1 DME Agency:  [facility]  HH Arranged:  PT HH Agency:   ([facility])  Status of Service:  Completed, signed off  If discussed at Long Length of Stay Meetings, dates discussed:    Additional Comments:  Lawerance SabalDebbie Brennan Karam, RN 01/21/2017, 9:06 AM

## 2017-01-23 DIAGNOSIS — R269 Unspecified abnormalities of gait and mobility: Secondary | ICD-10-CM | POA: Diagnosis not present

## 2017-01-24 DIAGNOSIS — G8929 Other chronic pain: Secondary | ICD-10-CM | POA: Diagnosis not present

## 2017-01-24 DIAGNOSIS — F17210 Nicotine dependence, cigarettes, uncomplicated: Secondary | ICD-10-CM | POA: Diagnosis not present

## 2017-01-24 DIAGNOSIS — I10 Essential (primary) hypertension: Secondary | ICD-10-CM | POA: Diagnosis not present

## 2017-01-24 DIAGNOSIS — Z452 Encounter for adjustment and management of vascular access device: Secondary | ICD-10-CM | POA: Diagnosis not present

## 2017-01-24 DIAGNOSIS — G9009 Other idiopathic peripheral autonomic neuropathy: Secondary | ICD-10-CM | POA: Diagnosis not present

## 2017-01-24 DIAGNOSIS — M1990 Unspecified osteoarthritis, unspecified site: Secondary | ICD-10-CM | POA: Diagnosis not present

## 2017-01-24 DIAGNOSIS — Z7901 Long term (current) use of anticoagulants: Secondary | ICD-10-CM | POA: Diagnosis not present

## 2017-01-24 DIAGNOSIS — Z792 Long term (current) use of antibiotics: Secondary | ICD-10-CM | POA: Diagnosis not present

## 2017-01-24 DIAGNOSIS — S32010D Wedge compression fracture of first lumbar vertebra, subsequent encounter for fracture with routine healing: Secondary | ICD-10-CM | POA: Diagnosis not present

## 2017-01-24 DIAGNOSIS — Z7982 Long term (current) use of aspirin: Secondary | ICD-10-CM | POA: Diagnosis not present

## 2017-01-31 ENCOUNTER — Other Ambulatory Visit (HOSPITAL_COMMUNITY): Payer: Self-pay | Admitting: Neurosurgery

## 2017-01-31 ENCOUNTER — Ambulatory Visit (HOSPITAL_COMMUNITY): Admission: RE | Admit: 2017-01-31 | Payer: Medicare Other | Source: Ambulatory Visit

## 2017-01-31 DIAGNOSIS — M79661 Pain in right lower leg: Secondary | ICD-10-CM

## 2017-01-31 DIAGNOSIS — R600 Localized edema: Secondary | ICD-10-CM | POA: Diagnosis not present

## 2017-01-31 DIAGNOSIS — M7989 Other specified soft tissue disorders: Principal | ICD-10-CM

## 2017-01-31 DIAGNOSIS — K5903 Drug induced constipation: Secondary | ICD-10-CM | POA: Diagnosis not present

## 2017-02-02 DIAGNOSIS — Z6839 Body mass index (BMI) 39.0-39.9, adult: Secondary | ICD-10-CM | POA: Diagnosis not present

## 2017-02-02 DIAGNOSIS — I10 Essential (primary) hypertension: Secondary | ICD-10-CM | POA: Diagnosis not present

## 2017-02-02 DIAGNOSIS — S32010A Wedge compression fracture of first lumbar vertebra, initial encounter for closed fracture: Secondary | ICD-10-CM | POA: Diagnosis not present

## 2017-02-13 ENCOUNTER — Inpatient Hospital Stay (HOSPITAL_COMMUNITY)
Admission: AD | Admit: 2017-02-13 | Discharge: 2017-02-17 | DRG: 857 | Disposition: A | Discharge status: Home Health | Payer: Medicare Other | Source: Other Acute Inpatient Hospital | Attending: Neurosurgery | Admitting: Neurosurgery

## 2017-02-13 ENCOUNTER — Encounter (HOSPITAL_COMMUNITY): Payer: Self-pay | Admitting: *Deleted

## 2017-02-13 DIAGNOSIS — T814XXA Infection following a procedure, initial encounter: Secondary | ICD-10-CM | POA: Diagnosis not present

## 2017-02-13 DIAGNOSIS — K219 Gastro-esophageal reflux disease without esophagitis: Secondary | ICD-10-CM | POA: Diagnosis present

## 2017-02-13 DIAGNOSIS — I10 Essential (primary) hypertension: Secondary | ICD-10-CM | POA: Diagnosis present

## 2017-02-13 DIAGNOSIS — K2270 Barrett's esophagus without dysplasia: Secondary | ICD-10-CM | POA: Diagnosis present

## 2017-02-13 DIAGNOSIS — A419 Sepsis, unspecified organism: Secondary | ICD-10-CM | POA: Diagnosis not present

## 2017-02-13 DIAGNOSIS — Z880 Allergy status to penicillin: Secondary | ICD-10-CM

## 2017-02-13 DIAGNOSIS — Z79899 Other long term (current) drug therapy: Secondary | ICD-10-CM

## 2017-02-13 DIAGNOSIS — B9562 Methicillin resistant Staphylococcus aureus infection as the cause of diseases classified elsewhere: Secondary | ICD-10-CM | POA: Diagnosis present

## 2017-02-13 DIAGNOSIS — G629 Polyneuropathy, unspecified: Secondary | ICD-10-CM | POA: Diagnosis present

## 2017-02-13 DIAGNOSIS — S32010A Wedge compression fracture of first lumbar vertebra, initial encounter for closed fracture: Secondary | ICD-10-CM

## 2017-02-13 DIAGNOSIS — M549 Dorsalgia, unspecified: Secondary | ICD-10-CM | POA: Diagnosis not present

## 2017-02-13 DIAGNOSIS — F17210 Nicotine dependence, cigarettes, uncomplicated: Secondary | ICD-10-CM | POA: Diagnosis not present

## 2017-02-13 DIAGNOSIS — Z6841 Body Mass Index (BMI) 40.0 and over, adult: Secondary | ICD-10-CM

## 2017-02-13 DIAGNOSIS — M545 Low back pain: Secondary | ICD-10-CM | POA: Diagnosis not present

## 2017-02-13 DIAGNOSIS — Z981 Arthrodesis status: Secondary | ICD-10-CM | POA: Diagnosis not present

## 2017-02-13 DIAGNOSIS — Z96643 Presence of artificial hip joint, bilateral: Secondary | ICD-10-CM | POA: Diagnosis present

## 2017-02-13 DIAGNOSIS — T8130XA Disruption of wound, unspecified, initial encounter: Secondary | ICD-10-CM | POA: Diagnosis not present

## 2017-02-13 DIAGNOSIS — T8189XA Other complications of procedures, not elsewhere classified, initial encounter: Secondary | ICD-10-CM | POA: Diagnosis not present

## 2017-02-13 DIAGNOSIS — S32030A Wedge compression fracture of third lumbar vertebra, initial encounter for closed fracture: Secondary | ICD-10-CM | POA: Diagnosis not present

## 2017-02-13 DIAGNOSIS — L7682 Other postprocedural complications of skin and subcutaneous tissue: Secondary | ICD-10-CM | POA: Diagnosis not present

## 2017-02-13 DIAGNOSIS — S32011A Stable burst fracture of first lumbar vertebra, initial encounter for closed fracture: Secondary | ICD-10-CM | POA: Diagnosis not present

## 2017-02-13 DIAGNOSIS — T8131XA Disruption of external operation (surgical) wound, not elsewhere classified, initial encounter: Secondary | ICD-10-CM | POA: Diagnosis not present

## 2017-02-13 DIAGNOSIS — S31000A Unspecified open wound of lower back and pelvis without penetration into retroperitoneum, initial encounter: Secondary | ICD-10-CM | POA: Diagnosis not present

## 2017-02-14 ENCOUNTER — Inpatient Hospital Stay (HOSPITAL_COMMUNITY): Payer: Medicare Other

## 2017-02-14 ENCOUNTER — Inpatient Hospital Stay (HOSPITAL_COMMUNITY): Payer: Medicare Other | Admitting: Anesthesiology

## 2017-02-14 ENCOUNTER — Encounter (HOSPITAL_COMMUNITY)
Admission: AD | Disposition: A | Discharge status: Home Health | Payer: Self-pay | Source: Other Acute Inpatient Hospital | Attending: Neurosurgery

## 2017-02-14 DIAGNOSIS — M549 Dorsalgia, unspecified: Secondary | ICD-10-CM | POA: Diagnosis present

## 2017-02-14 HISTORY — PX: LUMBAR WOUND DEBRIDEMENT: SHX1988

## 2017-02-14 LAB — HIV ANTIBODY (ROUTINE TESTING W REFLEX): HIV SCREEN 4TH GENERATION: NONREACTIVE

## 2017-02-14 LAB — CBC WITH DIFFERENTIAL/PLATELET
BASOS ABS: 0 10*3/uL (ref 0.0–0.1)
Basophils Relative: 0 %
EOS ABS: 0.8 10*3/uL — AB (ref 0.0–0.7)
EOS PCT: 9 %
HCT: 33.7 % — ABNORMAL LOW (ref 39.0–52.0)
HEMOGLOBIN: 10.5 g/dL — AB (ref 13.0–17.0)
LYMPHS ABS: 2.1 10*3/uL (ref 0.7–4.0)
Lymphocytes Relative: 23 %
MCH: 27.3 pg (ref 26.0–34.0)
MCHC: 31.2 g/dL (ref 30.0–36.0)
MCV: 87.8 fL (ref 78.0–100.0)
Monocytes Absolute: 1.2 10*3/uL — ABNORMAL HIGH (ref 0.1–1.0)
Monocytes Relative: 13 %
NEUTROS PCT: 55 %
Neutro Abs: 5 10*3/uL (ref 1.7–7.7)
PLATELETS: 212 10*3/uL (ref 150–400)
RBC: 3.84 MIL/uL — AB (ref 4.22–5.81)
RDW: 14.2 % (ref 11.5–15.5)
WBC: 9.1 10*3/uL (ref 4.0–10.5)

## 2017-02-14 LAB — GLUCOSE, CAPILLARY: GLUCOSE-CAPILLARY: 101 mg/dL — AB (ref 65–99)

## 2017-02-14 LAB — COMPREHENSIVE METABOLIC PANEL
ALT: 25 U/L (ref 17–63)
AST: 40 U/L (ref 15–41)
Albumin: 3 g/dL — ABNORMAL LOW (ref 3.5–5.0)
Alkaline Phosphatase: 116 U/L (ref 38–126)
Anion gap: 8 (ref 5–15)
BILIRUBIN TOTAL: 0.9 mg/dL (ref 0.3–1.2)
BUN: 5 mg/dL — AB (ref 6–20)
CHLORIDE: 101 mmol/L (ref 101–111)
CO2: 28 mmol/L (ref 22–32)
CREATININE: 0.58 mg/dL — AB (ref 0.61–1.24)
Calcium: 8.6 mg/dL — ABNORMAL LOW (ref 8.9–10.3)
Glucose, Bld: 104 mg/dL — ABNORMAL HIGH (ref 65–99)
POTASSIUM: 4.3 mmol/L (ref 3.5–5.1)
Sodium: 137 mmol/L (ref 135–145)
TOTAL PROTEIN: 6.6 g/dL (ref 6.5–8.1)

## 2017-02-14 LAB — SEDIMENTATION RATE: SED RATE: 25 mm/h — AB (ref 0–16)

## 2017-02-14 LAB — C-REACTIVE PROTEIN: CRP: 5.7 mg/dL — AB (ref <1.0)

## 2017-02-14 SURGERY — LUMBAR WOUND DEBRIDEMENT
Anesthesia: General | Site: Back

## 2017-02-14 MED ORDER — POTASSIUM CHLORIDE IN NACL 20-0.9 MEQ/L-% IV SOLN
INTRAVENOUS | Status: DC
  Administered 2017-02-14 – 2017-02-15 (×2): via INTRAVENOUS
  Administered 2017-02-15 – 2017-02-16 (×2): 1000 mL via INTRAVENOUS
  Filled 2017-02-14 (×5): qty 1000

## 2017-02-14 MED ORDER — FENTANYL CITRATE (PF) 100 MCG/2ML IJ SOLN
INTRAMUSCULAR | Status: AC
  Filled 2017-02-14: qty 2

## 2017-02-14 MED ORDER — MIDAZOLAM HCL 2 MG/2ML IJ SOLN
INTRAMUSCULAR | Status: AC
  Filled 2017-02-14: qty 2

## 2017-02-14 MED ORDER — SODIUM CHLORIDE 0.9 % IV SOLN: 250.0000 mL | INTRAVENOUS | Status: DC

## 2017-02-14 MED ORDER — ETODOLAC 400 MG PO TABS
400.0000 mg | ORAL_TABLET | Freq: Two times a day (BID) | ORAL | Status: DC
Start: 2017-02-14 — End: 2017-02-17
  Administered 2017-02-14 – 2017-02-17 (×8): 400 mg via ORAL
  Filled 2017-02-14 (×8): qty 1

## 2017-02-14 MED ORDER — ONDANSETRON HCL 4 MG/2ML IJ SOLN
INTRAMUSCULAR | Status: DC | PRN
  Administered 2017-02-14: 4 mg via INTRAVENOUS

## 2017-02-14 MED ORDER — LIDOCAINE HCL (CARDIAC) 20 MG/ML IV SOLN
INTRAVENOUS | Status: DC | PRN
  Administered 2017-02-14: 40 mg via INTRAVENOUS

## 2017-02-14 MED ORDER — PROPOFOL 10 MG/ML IV BOLUS
INTRAVENOUS | Status: AC
  Filled 2017-02-14: qty 40

## 2017-02-14 MED ORDER — PHENOL 1.4 % MT LIQD: 1.0000 | OROMUCOSAL | Status: DC | PRN

## 2017-02-14 MED ORDER — OMEGA-3-ACID ETHYL ESTERS 1 G PO CAPS
1.0000 g | ORAL_CAPSULE | Freq: Every day | ORAL | Status: DC
  Administered 2017-02-14 – 2017-02-17 (×4): 1 g via ORAL
  Filled 2017-02-14 (×4): qty 1

## 2017-02-14 MED ORDER — VANCOMYCIN HCL 1000 MG IV SOLR
INTRAVENOUS | Status: AC
  Filled 2017-02-14: qty 1000

## 2017-02-14 MED ORDER — ADULT MULTIVITAMIN W/MINERALS CH
1.0000 | ORAL_TABLET | Freq: Every day | ORAL | Status: DC
  Administered 2017-02-14 – 2017-02-17 (×4): 1 via ORAL
  Filled 2017-02-14 (×4): qty 1

## 2017-02-14 MED ORDER — CYCLOBENZAPRINE HCL 10 MG PO TABS: 10.0000 mg | ORAL_TABLET | Freq: Three times a day (TID) | ORAL | Status: DC | PRN

## 2017-02-14 MED ORDER — ONDANSETRON HCL 4 MG/2ML IJ SOLN
INTRAMUSCULAR | Status: AC
  Filled 2017-02-14: qty 2

## 2017-02-14 MED ORDER — PROPOFOL 10 MG/ML IV BOLUS
INTRAVENOUS | Status: DC | PRN
  Administered 2017-02-14: 180 mg via INTRAVENOUS

## 2017-02-14 MED ORDER — BACITRACIN 50000 UNITS IM SOLR
INTRAMUSCULAR | Status: AC
  Administered 2017-02-14: 15:00:00
  Filled 2017-02-14: qty 3000

## 2017-02-14 MED ORDER — DEXTROSE 5 % IV SOLN
2.0000 g | INTRAVENOUS | Status: DC
  Administered 2017-02-14 – 2017-02-16 (×3): 2 g via INTRAVENOUS
  Filled 2017-02-14 (×3): qty 2

## 2017-02-14 MED ORDER — LIDOCAINE 2% (20 MG/ML) 5 ML SYRINGE
INTRAMUSCULAR | Status: AC
  Filled 2017-02-14: qty 5

## 2017-02-14 MED ORDER — OXYCODONE-ACETAMINOPHEN 5-325 MG PO TABS
1.0000 | ORAL_TABLET | Freq: Once | ORAL | Status: AC
  Administered 2017-02-14: 2 via ORAL
  Filled 2017-02-14: qty 2

## 2017-02-14 MED ORDER — HYDROCODONE-ACETAMINOPHEN 5-325 MG PO TABS
1.0000 | ORAL_TABLET | ORAL | Status: DC | PRN
  Administered 2017-02-14 – 2017-02-15 (×2): 2 via ORAL
  Filled 2017-02-14 (×2): qty 2

## 2017-02-14 MED ORDER — SUGAMMADEX SODIUM 200 MG/2ML IV SOLN
INTRAVENOUS | Status: DC | PRN
  Administered 2017-02-14: 200 mg via INTRAVENOUS

## 2017-02-14 MED ORDER — HYDROMORPHONE HCL 1 MG/ML IJ SOLN
0.5000 mg | INTRAMUSCULAR | Status: DC | PRN
  Administered 2017-02-14: 1 mg via INTRAVENOUS

## 2017-02-14 MED ORDER — ACETAMINOPHEN 325 MG PO TABS
650.0000 mg | ORAL_TABLET | Freq: Four times a day (QID) | ORAL | Status: DC | PRN
  Administered 2017-02-16: 650 mg via ORAL
  Filled 2017-02-14: qty 2

## 2017-02-14 MED ORDER — HYDROMORPHONE HCL 1 MG/ML IJ SOLN
INTRAMUSCULAR | Status: AC
  Filled 2017-02-14: qty 2

## 2017-02-14 MED ORDER — ACETAMINOPHEN 650 MG RE SUPP: 650.0000 mg | Freq: Four times a day (QID) | RECTAL | Status: DC | PRN

## 2017-02-14 MED ORDER — MENTHOL 3 MG MT LOZG: 1.0000 | LOZENGE | OROMUCOSAL | Status: DC | PRN

## 2017-02-14 MED ORDER — CEFAZOLIN SODIUM-DEXTROSE 1-4 GM/50ML-% IV SOLN
INTRAVENOUS | Status: DC | PRN
  Administered 2017-02-14: 2 g via INTRAVENOUS

## 2017-02-14 MED ORDER — VANCOMYCIN HCL IN DEXTROSE 1-5 GM/200ML-% IV SOLN
1000.0000 mg | Freq: Two times a day (BID) | INTRAVENOUS | Status: DC
  Administered 2017-02-15 – 2017-02-17 (×6): 1000 mg via INTRAVENOUS
  Filled 2017-02-14 (×7): qty 200

## 2017-02-14 MED ORDER — OXYCODONE HCL 5 MG PO TABS
ORAL_TABLET | ORAL | Status: AC
  Filled 2017-02-14: qty 3

## 2017-02-14 MED ORDER — HYDROMORPHONE HCL 1 MG/ML IJ SOLN
0.5000 mg | INTRAMUSCULAR | Status: DC | PRN
  Administered 2017-02-14 – 2017-02-15 (×7): 1 mg via INTRAVENOUS
  Filled 2017-02-14 (×7): qty 1

## 2017-02-14 MED ORDER — ROCURONIUM BROMIDE 100 MG/10ML IV SOLN
INTRAVENOUS | Status: DC | PRN
  Administered 2017-02-14: 50 mg via INTRAVENOUS

## 2017-02-14 MED ORDER — GARLIC 1000 MG PO CAPS: 1000.0000 mg | ORAL_CAPSULE | Freq: Every day | ORAL | Status: DC

## 2017-02-14 MED ORDER — GADOBENATE DIMEGLUMINE 529 MG/ML IV SOLN
20.0000 mL | Freq: Once | INTRAVENOUS | Status: AC | PRN
  Administered 2017-02-14: 20 mL via INTRAVENOUS

## 2017-02-14 MED ORDER — LACTATED RINGERS IV SOLN
INTRAVENOUS | Status: DC
  Administered 2017-02-14 (×2): via INTRAVENOUS

## 2017-02-14 MED ORDER — ONDANSETRON HCL 4 MG/2ML IJ SOLN: 4.0000 mg | Freq: Four times a day (QID) | INTRAMUSCULAR | Status: DC | PRN

## 2017-02-14 MED ORDER — OXYCODONE HCL 5 MG PO TABS
5.0000 mg | ORAL_TABLET | Freq: Once | ORAL | Status: AC | PRN
  Administered 2017-02-14: 5 mg via ORAL

## 2017-02-14 MED ORDER — SODIUM CHLORIDE 0.9% FLUSH: 3.0000 mL | INTRAVENOUS | Status: DC | PRN

## 2017-02-14 MED ORDER — OXYCODONE HCL 5 MG/5ML PO SOLN: 5.0000 mg | Freq: Once | ORAL | Status: AC | PRN

## 2017-02-14 MED ORDER — GABAPENTIN 600 MG PO TABS
600.0000 mg | ORAL_TABLET | Freq: Two times a day (BID) | ORAL | Status: DC
  Administered 2017-02-14 – 2017-02-17 (×7): 600 mg via ORAL
  Filled 2017-02-14 (×7): qty 1

## 2017-02-14 MED ORDER — GABAPENTIN 600 MG PO TABS: 600.0000 mg | ORAL_TABLET | Freq: Three times a day (TID) | ORAL | Status: DC

## 2017-02-14 MED ORDER — ROCURONIUM BROMIDE 10 MG/ML (PF) SYRINGE
PREFILLED_SYRINGE | INTRAVENOUS | Status: AC
  Filled 2017-02-14: qty 5

## 2017-02-14 MED ORDER — ONDANSETRON HCL 4 MG PO TABS: 4.0000 mg | ORAL_TABLET | Freq: Four times a day (QID) | ORAL | Status: DC | PRN

## 2017-02-14 MED ORDER — HYDROMORPHONE HCL 1 MG/ML IJ SOLN
INTRAMUSCULAR | Status: AC
Start: 2017-02-14 — End: 2017-02-14
  Administered 2017-02-14: 2 mg
  Filled 2017-02-14: qty 2

## 2017-02-14 MED ORDER — OXYCODONE HCL 5 MG PO TABS
5.0000 mg | ORAL_TABLET | ORAL | Status: DC | PRN
  Administered 2017-02-14 – 2017-02-15 (×5): 10 mg via ORAL
  Filled 2017-02-14 (×4): qty 2

## 2017-02-14 MED ORDER — PANTOPRAZOLE SODIUM 40 MG PO TBEC
40.0000 mg | DELAYED_RELEASE_TABLET | Freq: Every day | ORAL | Status: DC
  Administered 2017-02-14 – 2017-02-17 (×4): 40 mg via ORAL
  Filled 2017-02-14 (×4): qty 1

## 2017-02-14 MED ORDER — GABAPENTIN 600 MG PO TABS
1200.0000 mg | ORAL_TABLET | Freq: Every day | ORAL | Status: DC
  Administered 2017-02-14 – 2017-02-16 (×4): 1200 mg via ORAL
  Filled 2017-02-14 (×4): qty 2

## 2017-02-14 MED ORDER — THROMBIN 5000 UNITS EX SOLR
CUTANEOUS | Status: AC
  Filled 2017-02-14: qty 10000

## 2017-02-14 MED ORDER — OXYCODONE HCL ER 15 MG PO T12A
30.0000 mg | EXTENDED_RELEASE_TABLET | Freq: Two times a day (BID) | ORAL | Status: DC
  Administered 2017-02-14 – 2017-02-17 (×8): 30 mg via ORAL
  Filled 2017-02-14 (×8): qty 2

## 2017-02-14 MED ORDER — KETOROLAC TROMETHAMINE 15 MG/ML IJ SOLN
30.0000 mg | Freq: Four times a day (QID) | INTRAMUSCULAR | Status: AC
  Administered 2017-02-14 – 2017-02-15 (×3): 30 mg via INTRAVENOUS
  Filled 2017-02-14 (×3): qty 2

## 2017-02-14 MED ORDER — DIAZEPAM 5 MG PO TABS
5.0000 mg | ORAL_TABLET | Freq: Four times a day (QID) | ORAL | Status: DC | PRN
  Administered 2017-02-14 – 2017-02-17 (×7): 5 mg via ORAL
  Filled 2017-02-14 (×7): qty 1

## 2017-02-14 MED ORDER — FENTANYL CITRATE (PF) 250 MCG/5ML IJ SOLN
INTRAMUSCULAR | Status: AC
  Filled 2017-02-14: qty 5

## 2017-02-14 MED ORDER — FENTANYL CITRATE (PF) 100 MCG/2ML IJ SOLN
INTRAMUSCULAR | Status: DC | PRN
  Administered 2017-02-14: 50 ug via INTRAVENOUS
  Administered 2017-02-14: 100 ug via INTRAVENOUS
  Administered 2017-02-14 (×2): 25 ug via INTRAVENOUS
  Administered 2017-02-14: 50 ug via INTRAVENOUS

## 2017-02-14 MED ORDER — SUGAMMADEX SODIUM 200 MG/2ML IV SOLN
INTRAVENOUS | Status: AC
  Filled 2017-02-14: qty 2

## 2017-02-14 MED ORDER — SODIUM CHLORIDE 0.9% FLUSH
3.0000 mL | Freq: Two times a day (BID) | INTRAVENOUS | Status: DC
  Administered 2017-02-14 – 2017-02-17 (×2): 3 mL via INTRAVENOUS

## 2017-02-14 MED ORDER — VANCOMYCIN HCL 1000 MG IV SOLR
INTRAVENOUS | Status: DC | PRN
  Administered 2017-02-14: 1000 mg via TOPICAL

## 2017-02-14 MED ORDER — FENTANYL CITRATE (PF) 100 MCG/2ML IJ SOLN
25.0000 ug | INTRAMUSCULAR | Status: DC | PRN
  Administered 2017-02-14: 50 ug via INTRAVENOUS

## 2017-02-14 SURGICAL SUPPLY — 53 items
BAG DECANTER FOR FLEXI CONT (MISCELLANEOUS) IMPLANT
BENZOIN TINCTURE PRP APPL 2/3 (GAUZE/BANDAGES/DRESSINGS) IMPLANT
BLADE CLIPPER SURG (BLADE) IMPLANT
CANISTER SUCT 3000ML PPV (MISCELLANEOUS) ×3 IMPLANT
CARTRIDGE OIL MAESTRO DRILL (MISCELLANEOUS) IMPLANT
CLOSURE WOUND 1/2 X4 (GAUZE/BANDAGES/DRESSINGS)
DERMABOND ADVANCED (GAUZE/BANDAGES/DRESSINGS)
DERMABOND ADVANCED .7 DNX12 (GAUZE/BANDAGES/DRESSINGS) IMPLANT
DIFFUSER DRILL AIR PNEUMATIC (MISCELLANEOUS) IMPLANT
DRAPE LAPAROTOMY 100X72X124 (DRAPES) ×3 IMPLANT
DRAPE POUCH INSTRU U-SHP 10X18 (DRAPES) ×3 IMPLANT
DRAPE SURG 17X23 STRL (DRAPES) IMPLANT
ELECT REM PT RETURN 9FT ADLT (ELECTROSURGICAL) ×3
ELECTRODE REM PT RTRN 9FT ADLT (ELECTROSURGICAL) ×1 IMPLANT
EVACUATOR 1/8 PVC DRAIN (DRAIN) IMPLANT
EVACUATOR 3/16  PVC DRAIN (DRAIN) ×2
EVACUATOR 3/16 PVC DRAIN (DRAIN) ×1 IMPLANT
GAUZE SPONGE 4X4 12PLY STRL (GAUZE/BANDAGES/DRESSINGS) IMPLANT
GAUZE SPONGE 4X4 12PLY STRL LF (GAUZE/BANDAGES/DRESSINGS) ×3 IMPLANT
GAUZE SPONGE 4X4 16PLY XRAY LF (GAUZE/BANDAGES/DRESSINGS) IMPLANT
GLOVE BIOGEL PI IND STRL 6.5 (GLOVE) ×2 IMPLANT
GLOVE BIOGEL PI INDICATOR 6.5 (GLOVE) ×4
GLOVE ECLIPSE 9.0 STRL (GLOVE) ×3 IMPLANT
GLOVE EXAM NITRILE LRG STRL (GLOVE) IMPLANT
GLOVE EXAM NITRILE XL STR (GLOVE) IMPLANT
GLOVE EXAM NITRILE XS STR PU (GLOVE) ×3 IMPLANT
GLOVE SURG SS PI 6.5 STRL IVOR (GLOVE) ×6 IMPLANT
GOWN STRL REUS W/ TWL LRG LVL3 (GOWN DISPOSABLE) ×2 IMPLANT
GOWN STRL REUS W/ TWL XL LVL3 (GOWN DISPOSABLE) ×1 IMPLANT
GOWN STRL REUS W/TWL 2XL LVL3 (GOWN DISPOSABLE) IMPLANT
GOWN STRL REUS W/TWL LRG LVL3 (GOWN DISPOSABLE) ×4
GOWN STRL REUS W/TWL XL LVL3 (GOWN DISPOSABLE) ×2
HANDPIECE INTERPULSE COAX TIP (DISPOSABLE) ×2
KIT BASIN OR (CUSTOM PROCEDURE TRAY) ×3 IMPLANT
KIT ROOM TURNOVER OR (KITS) ×3 IMPLANT
NEEDLE HYPO 25X1 1.5 SAFETY (NEEDLE) IMPLANT
NS IRRIG 1000ML POUR BTL (IV SOLUTION) ×3 IMPLANT
OIL CARTRIDGE MAESTRO DRILL (MISCELLANEOUS)
PACK LAMINECTOMY NEURO (CUSTOM PROCEDURE TRAY) ×3 IMPLANT
SET HNDPC FAN SPRY TIP SCT (DISPOSABLE) ×1 IMPLANT
SPONGE SURGIFOAM ABS GEL SZ50 (HEMOSTASIS) IMPLANT
STRIP CLOSURE SKIN 1/2X4 (GAUZE/BANDAGES/DRESSINGS) IMPLANT
SUT ETHILON 2 0 FS 18 (SUTURE) ×12 IMPLANT
SUT ETHILON 3 0 FSL (SUTURE) ×3 IMPLANT
SUT VIC AB 0 CT1 18XCR BRD8 (SUTURE) ×1 IMPLANT
SUT VIC AB 0 CT1 8-18 (SUTURE) ×2
SUT VIC AB 2-0 CT1 18 (SUTURE) IMPLANT
SWAB COLLECTION DEVICE MRSA (MISCELLANEOUS) ×3 IMPLANT
SWAB CULTURE ESWAB REG 1ML (MISCELLANEOUS) IMPLANT
TAPE CLOTH SURG 4X10 WHT LF (GAUZE/BANDAGES/DRESSINGS) ×3 IMPLANT
TOWEL GREEN STERILE (TOWEL DISPOSABLE) IMPLANT
TOWEL GREEN STERILE FF (TOWEL DISPOSABLE) ×3 IMPLANT
WATER STERILE IRR 1000ML POUR (IV SOLUTION) ×3 IMPLANT

## 2017-02-14 NOTE — Anesthesia Procedure Notes (Signed)
Procedure Name: Intubation Date/Time: 02/14/2017 2:20 PM Performed by: Scheryl Darter Pre-anesthesia Checklist: Patient identified, Emergency Drugs available, Suction available and Patient being monitored Patient Re-evaluated:Patient Re-evaluated prior to induction Oxygen Delivery Method: Circle System Utilized Preoxygenation: Pre-oxygenation with 100% oxygen Induction Type: IV induction Ventilation: Mask ventilation without difficulty Laryngoscope Size: Mac and 3 Grade View: Grade I Tube type: Oral Tube size: 7.5 mm Number of attempts: 1 Airway Equipment and Method: Stylet and Oral airway Placement Confirmation: ETT inserted through vocal cords under direct vision,  positive ETCO2 and breath sounds checked- equal and bilateral Secured at: 23 cm Tube secured with: Tape Dental Injury: Teeth and Oropharynx as per pre-operative assessment

## 2017-02-14 NOTE — Anesthesia Preprocedure Evaluation (Signed)
Anesthesia Evaluation  Patient identified by MRN, date of birth, ID band Patient awake    Reviewed: Allergy & Precautions, H&P , NPO status , Patient's Chart, lab work & pertinent test results  History of Anesthesia Complications Negative for: history of anesthetic complications  Airway Mallampati: II  TM Distance: >3 FB Neck ROM: Full    Dental no notable dental hx. (+) Edentulous Upper, Dental Advisory Given   Pulmonary Current Smoker,    Pulmonary exam normal breath sounds clear to auscultation       Cardiovascular hypertension, Pt. on medications  Rhythm:Regular Rate:Normal     Neuro/Psych negative neurological ROS  negative psych ROS   GI/Hepatic Neg liver ROS, GERD  Medicated and Controlled,  Endo/Other  negative endocrine ROS  Renal/GU negative Renal ROS  negative genitourinary   Musculoskeletal  (+) Arthritis , Osteoarthritis,    Abdominal (+) + obese,   Peds  Hematology negative hematology ROS (+)   Anesthesia Other Findings   Reproductive/Obstetrics negative OB ROS                             Anesthesia Physical Anesthesia Plan  ASA: III  Anesthesia Plan: General   Post-op Pain Management:    Induction: Intravenous  PONV Risk Score and Plan: 1 and Ondansetron  Airway Management Planned: Oral ETT  Additional Equipment: None  Intra-op Plan:   Post-operative Plan: Extubation in OR  Informed Consent: I have reviewed the patients History and Physical, chart, labs and discussed the procedure including the risks, benefits and alternatives for the proposed anesthesia with the patient or authorized representative who has indicated his/her understanding and acceptance.   Dental advisory given  Plan Discussed with: CRNA and Surgeon  Anesthesia Plan Comments:         Anesthesia Quick Evaluation                                   Anesthesia Evaluation  Patient  identified by MRN, date of birth, ID band Patient awake    Reviewed: Allergy & Precautions, H&P , NPO status , Patient's Chart, lab work & pertinent test results  Airway Mallampati: II  TM Distance: >3 FB Neck ROM: Full    Dental no notable dental hx. (+) Edentulous Upper, Dental Advisory Given   Pulmonary Current Smoker,    Pulmonary exam normal breath sounds clear to auscultation       Cardiovascular hypertension, Pt. on medications  Rhythm:Regular Rate:Normal     Neuro/Psych negative neurological ROS  negative psych ROS   GI/Hepatic Neg liver ROS, GERD  Medicated and Controlled,  Endo/Other  negative endocrine ROS  Renal/GU negative Renal ROS  negative genitourinary   Musculoskeletal  (+) Arthritis , Osteoarthritis,    Abdominal (+) + obese,   Peds  Hematology negative hematology ROS (+)   Anesthesia Other Findings   Reproductive/Obstetrics negative OB ROS                            Anesthesia Physical Anesthesia Plan  ASA: III  Anesthesia Plan: General   Post-op Pain Management:    Induction: Intravenous  PONV Risk Score and Plan: Dexamethasone and Ondansetron  Airway Management Planned: Oral ETT  Additional Equipment:   Intra-op Plan:   Post-operative Plan: Extubation in OR  Informed Consent: I have reviewed the patients  History and Physical, chart, labs and discussed the procedure including the risks, benefits and alternatives for the proposed anesthesia with the patient or authorized representative who has indicated his/her understanding and acceptance.   Dental advisory given  Plan Discussed with: CRNA  Anesthesia Plan Comments:        Anesthesia Quick Evaluation

## 2017-02-14 NOTE — Progress Notes (Signed)
11:12pm paged neurosurgery answering service..no call back/ no orders 00:29 paged neurosurgery again.

## 2017-02-14 NOTE — Brief Op Note (Signed)
02/13/2017 - 02/14/2017  3:01 PM  PATIENT:  Vanetta MuldersBilly Charles Lafalce Jr.  56 y.o. male  PRE-OPERATIVE DIAGNOSIS:  Possible wound infection  POST-OPERATIVE DIAGNOSIS:  Possible wound infection  PROCEDURE:  Procedure(s): LUMBAR WOUND DEBRIDEMENT (N/A)  SURGEON:  Surgeon(s) and Role:    * Julio SicksPool, Trinidy Masterson, MD - Primary  PHYSICIAN ASSISTANT:   ASSISTANTS:    ANESTHESIA:   general  EBL:  Total I/O In: 800 [I.V.:800] Out: 20 [Blood:20]  BLOOD ADMINISTERED:none  DRAINS: (med/large) Hemovact drain(s) in the sup wd space with  Suction Open   LOCAL MEDICATIONS USED:  MARCAINE     SPECIMEN:  No Specimen  DISPOSITION OF SPECIMEN:  N/A  COUNTS:  YES  TOURNIQUET:  * No tourniquets in log *  DICTATION: .Dragon Dictation  PLAN OF CARE: Admit to inpatient   PATIENT DISPOSITION:  PACU - hemodynamically stable.   Delay start of Pharmacological VTE agent (>24hrs) due to surgical blood loss or risk of bleeding: yes

## 2017-02-14 NOTE — Transfer of Care (Signed)
Immediate Anesthesia Transfer of Care Note  Patient: Sean MuldersBilly Charles Leckey Jr.  Procedure(s) Performed: Procedure(s): LUMBAR WOUND DEBRIDEMENT (N/A)  Patient Location: PACU  Anesthesia Type:General  Level of Consciousness: awake, alert  and oriented  Airway & Oxygen Therapy: Patient Spontanous Breathing and Patient connected to nasal cannula oxygen  Post-op Assessment: Report given to RN, Post -op Vital signs reviewed and stable and Patient moving all extremities X 4  Post vital signs: Reviewed and stable  Last Vitals:  Vitals:   02/14/17 0955 02/14/17 1242  BP: 118/71 128/72  Pulse: 92 88  Resp: 19 20  Temp: (!) 33.3 C 36.9 C    Last Pain:  Vitals:   02/14/17 1242  TempSrc: Oral  PainSc:       Patients Stated Pain Goal: 2 (02/14/17 0900)  Complications: No apparent anesthesia complications

## 2017-02-14 NOTE — Progress Notes (Signed)
Patient afebrile overnight. He continues to have quite a bit of back pain. Still with some intermittent purulent drainage from the [facility] aspect of his wound. MRI scan last night demonstrates evidence of a significant superficial wound compartment fluid collection consistent with either seroma or superficial infection. No evidence of deep wound space infection. Nothing to explain his right lower extremity pain.  Laboratory studies appear reasonably good. His white count is normal. His sedimentation rate is 25.  I can't be sure whether this represents a superficial wound compartment infection versus a draining seroma. Plan to take the patient and the operating room for formal irrigation debridement of his wound with reclosure. Cultures will be taken. I discussed the risks and benefits with the patient. He agrees to proceed.

## 2017-02-14 NOTE — Op Note (Signed)
Date of procedure: 02/14/2017  Date of dictation: Same  Service: Neurosurgery  Preoperative diagnosis: Possible thoracic and lumbar wound infection  Postoperative diagnosis: Same  Procedure Name: Reexploration of thoracic lumbar wound with irrigation and debridement  Surgeon:Camden Knotek A.Chanequa Spees, M.D.  Asst. Surgeon: None  Anesthesia: General  Indication: 56 year old male with superficial wound dehiscence intermittent drainage.  MRI scan with a superficial wound would collection possibly consistent with infection versus seroma.  Patient presents now for reexploration and revision of his wound.  No fever.  Sedimentation rate and white count normal.  Operative note: After induction of anesthesia, patient positioned prone on a Wilson frame and appropriate padded.  Thoracic and lumbar region prepped and draped sterilely.  Lower aspect of his thoracic and lumbar wound was reopened.  Brownish clear fluid was obtained consistent with a seroma.  No evidence of any obvious purulence or infection.  The wound space itself was irrigated clean was Pulsavac.  Cultures were obtained.  Vancomycin powder displacement deep wound space.  The fascia was still intact.  Wound was brought together with 0 Vicryl sutures an  2-0 nylon sutures in an interrupted vertical mattress fashion at the surface.  A drain was left in the deep wound space.  Sterile dressing was applied.  No apparent complications.  Patient tolerated procedure well and returned to the recovery room.

## 2017-02-14 NOTE — Progress Notes (Signed)
Pharmacy Antibiotic Note  POD #0 s/p reexploration of throacic lumbar wound with I&D Consult for post-op vancomycin for surgical prophylaxis. Patient does have hemovac in place, therefore will continue vancomycin until dc'd by MD.  Plan: -Vancomycin 1g/12h -F/u cultures from I&D today -Monitor renal fx -Due to size of pt, consider trough early   Height: 5\' 8"  (172.7 cm) Weight: 274 lb (124.3 kg) IBW/kg (Calculated) : 68.4  Temp (24hrs), Avg:97.4 F (36.3 C), Min:92 F (33.3 C), Max:99.1 F (37.3 C)   Recent Labs Lab 02/14/17 0412  WBC 9.1  CREATININE 0.58*    Estimated Creatinine Clearance: 130.8 mL/min (A) (by C-G formula based on SCr of 0.58 mg/dL (L)).    Allergies  Allergen Reactions  . Penicillins Other (See Comments)    UNKNOWN REACTION  Has patient had a PCN reaction causing immediate rash, facial/tongue/throat swelling, SOB or lightheadedness with hypotension: Unknown Has patient had a PCN reaction causing severe rash involving mucus membranes or skin necrosis: No Has patient had a PCN reaction that required hospitalization: No Has patient had a PCN reaction occurring within the last 10 years:No If all of the above answers are "NO", then may proceed with Cephalosporin use.      Baldemar FridayMasters, Ayrton Mcvay M 02/14/2017 4:54 PM

## 2017-02-14 NOTE — Progress Notes (Signed)
PHARMACIST - PHYSICIAN ORDER COMMUNICATION  CONCERNING: P&T Medication Policy on Herbal Medications  DESCRIPTION:  This patient's order for:  Garlic  has been noted.  This product(s) is classified as an "herbal" or natural product. Due to a lack of definitive safety studies or FDA approval, nonstandard manufacturing practices, plus the potential risk of unknown drug-drug interactions while on inpatient medications, the Pharmacy and Therapeutics Committee does not permit the use of "herbal" or natural products of this type within [facility].   ACTION TAKEN: The pharmacy department is unable to verify this order at this time and your patient has been informed of this safety policy. Please reevaluate patient's clinical condition at discharge and address if the herbal or natural product(s) should be resumed at that time.   

## 2017-02-14 NOTE — H&P (Signed)
Sean Barber. is an 56 y.o. male.   Chief Complaint: back pain HPI: 56 year old m status post thoracic lumbar decompression and fusion approximately 4 weeks ago by [facility].  Patient presg back pain and right lower extremity pain. Patient also with increasing lower wound drainage. Patient complains of right lower pain and swelling. He has no history of new weakness or sensory loss.He has no changes in bowel or bladder dysfunction. He has of fever. There is a history of purulent drainage.DVT scan was negative  Past Medical History:  Diagnosis Date  . Arthritis   . Aspiration into airway    43 days in a coma  . Barrett's esophagus   . Chronic back pain   . Coma (HCC)    42 days  . GERD (gastroesophageal reflux disease)   . H/O tracheostomy   . Hypertension    no longer taking medications  . Neuropathy     Past Surgical History:  Procedure Laterality Date  . BACK SURGERY     lower back x2  . ESOPHAGOGASTRODUODENOSCOPY     several times  . HERNIA REPAIR  2006   umbilical  . HERNIA REPAIR  2012   upper right abdomen  . HIP SURGERY Bilateral    6 surgeries due to a fractured hip  . POSTERIOR LUMBAR FUSION 4 LEVEL N/A 01/18/2017   Procedure: Thoracic eleven t0 Lumbar three Posterior lateral fusion;  Surgeon: Coletta Memos, MD;  Location: [facility] OR;  Service: Neurosurgery;  Laterality: N/A;  . TOTAL HIP ARTHROPLASTY Right   . TOTAL HIP ARTHROPLASTY Left     Family History  Problem Relation Age of Onset  . Family history unknown: Yes   Social History:  reports that he has been smoking Cigarettes.  He has been smoking about 0.50 packs per day. He has never used smokeless tobacco. He reports that he does not drink alcohol or use drugs.  Allergies:  Allergies  Allergen Reactions  . Penicillins Other (See Comments)    UNKNOWN REACTION  Has patient had a PCN reaction causing immediate rash, facial/tongue/throat swelling, SOB or lightheadedness with hypotension:  Unknown Has patient had a PCN reaction causing severe rash involving mucus membranes or skin necrosis: No Has patient had a PCN reaction that required hospitalization: No Has patient had a PCN reaction occurring within the last 10 years:No If all of the above answers are "NO", then may proceed with Cephalosporin use.     Medications Prior to Admission  Medication Sig Dispense Refill  . aspirin 325 MG EC tablet Take 325 mg by mouth daily.    . diazepam (VALIUM) 5 MG tablet Take 1 tablet (5 mg total) by mouth every 6 (six) hours as needed for muscle spasms. 30 tablet 0  . etodolac (LODINE) 500 MG tablet Take 500 mg by mouth 2 (two) times daily.    Marland Kitchen gabapentin (NEURONTIN) 600 MG tablet Take 600-1,200 mg by mouth 3 (three) times daily. 600 mg in AM and Afternoon, 1200 mg at bedtime    . Garlic 1000 MG CAPS Take 1,000 mg by mouth daily.     . Multiple Vitamin (MULTIVITAMIN WITH MINERALS) TABS tablet Take 1 tablet by mouth daily.    Marland Kitchen omega-3 acid ethyl esters (LOVAZA) 1 g capsule Take 1 g by mouth daily.    Marland Kitchen omeprazole (PRILOSEC) 40 MG capsule Take 40 mg by mouth 2 (two) times daily.    Marland Kitchen oxyCODONE (OXY IR/ROXICODONE) 5 MG immediate release tablet Take 1-2 tablets (5-10  mg total) by mouth every 3 (three) hours as needed for breakthrough pain. 30 tablet 0  . oxyCODONE (OXYCONTIN) 30 MG 12 hr tablet Take 30 mg by mouth 2 (two) times daily. 30 each 0    No results found for this or any previous visit (from the past 48 hour(s)). No results found.  Pertinent items noted in HPI and remainder of comprehensive ROS otherwise negative.  Blood pressure 127/79, pulse 99, temperature 99 F (37.2 C), temperature source Oral, resp. rate 18, height 5\' 8"  (1.727 m), weight 124.3 kg (274 lb), SpO2 97 %.  Patient is awake and alert. He does not appear toxic. He is oriented and appropriate.Heis obviously in great detail pain. Examination of his head ears eyes known thirds unremarkable. Chest and abdomen are  benign. Extremities are free from injury or deformity.neurologically he has intact motor strength bilaterally. He does have pain with straight leg raising bilaterally. His wound is healing well except the inferior aspect which has some evidence of dehiscence. I cannot express any current drainage. Assessment/Plan Possible lumbar wound infection. Plan MRI scan lumbar spine. Admit for observation.  Szymon Foiles A 02/14/2017, 1:09 AM

## 2017-02-15 ENCOUNTER — Encounter (HOSPITAL_COMMUNITY): Payer: Self-pay | Admitting: Neurosurgery

## 2017-02-15 LAB — GLUCOSE, CAPILLARY
GLUCOSE-CAPILLARY: 200 mg/dL — AB (ref 65–99)
Glucose-Capillary: 158 mg/dL — ABNORMAL HIGH (ref 65–99)

## 2017-02-15 MED ORDER — CYCLOBENZAPRINE HCL 10 MG PO TABS
10.0000 mg | ORAL_TABLET | Freq: Three times a day (TID) | ORAL | Status: DC | PRN
  Administered 2017-02-15 – 2017-02-17 (×3): 10 mg via ORAL
  Filled 2017-02-15 (×3): qty 1

## 2017-02-15 MED ORDER — HYDROCODONE-ACETAMINOPHEN 5-325 MG PO TABS: 1.0000 | ORAL_TABLET | ORAL | Status: DC | PRN

## 2017-02-15 MED ORDER — OXYCODONE HCL 5 MG PO TABS: 5.0000 mg | ORAL_TABLET | ORAL | Status: DC | PRN

## 2017-02-15 MED ORDER — HYDROMORPHONE HCL 1 MG/ML IJ SOLN
0.5000 mg | INTRAMUSCULAR | Status: DC | PRN
  Administered 2017-02-16 (×2): 1 mg via INTRAVENOUS
  Administered 2017-02-16: 0.5 mg via INTRAVENOUS
  Administered 2017-02-16 – 2017-02-17 (×11): 1 mg via INTRAVENOUS
  Filled 2017-02-15 (×7): qty 1
  Filled 2017-02-15: qty 0.5
  Filled 2017-02-15 (×6): qty 1

## 2017-02-15 NOTE — Progress Notes (Signed)
Postoperative day 1. Patient overall stable. Still complaining of back pain and some right leg pain.  Afebrile. Vitals are stable. Drain output moderate. Wound clean and dry. Motor sensory exam stable.  Culture no growth so far. Gram stain negative.  Continue IV antibiotics for 72 hours until cultures finalized. If culture is negative then discharged home on oral antibiotics with cultures positive plantar discharged home on IV antibiotics

## 2017-02-15 NOTE — Evaluation (Signed)
Occupational Therapy Evaluation Patient Details Name: Sean Barber. MRN: [id number] DOB: Dec 28, 1960 Today's Date: 02/15/2017    History of Present Illness Pt is a 56 y/o male admitted secondary to increasing back pain and surgical site drainage. Pt with recent T11-L3 posterior lateral fusion surgery on 7/11. Pt is now s/p re-exploration of thoracic lumbar wound with I&D on 8/7. PMH including but not limited to HTN, neuropathy and chronic back pain   Clinical Impression   This 56 y/o M presents with the above. Prior to this admission Pt was mod independent with functional mobility, receiving intermittent assist from spouse for LB ADL completion. Pt currently requires minGuard assist for functional mobility. Pt verbalizes understanding and proper use of AE for completing LB ADLs with MinA. Requires reinforcement of back precautions and education provided on adherence to precautions during ADL completion. Pt will benefit from continued OT services while in acute setting to maximize Pt's safety and independence with ADLs and functional mobility. Will follow.     Follow Up Recommendations  No OT follow up;Supervision/Assistance - 24 hour    Equipment Recommendations  None recommended by OT           Precautions / Restrictions Precautions Precautions: Back Precaution Comments: Pt recalls 1/3 back precautions with mod questioning cues; reviewed 3/3 back precautions with Pt  Required Braces or Orthoses:  (MD orders state "no brace needed") Spinal Brace: Thoracolumbosacral orthotic;Applied in sitting position;Other (comment) Spinal Brace Comments: pt had TLSO present in room from his previous surgery and was used during this session Restrictions Weight Bearing Restrictions: No      Mobility Bed Mobility Overal bed mobility: Needs Assistance Bed Mobility: Supine to Sit     Supine to sit: Supervision;HOB elevated     General bed mobility comments: Pt with HOB raised to sitting  with LE partially hanging over edge resting on floor upon entering room, Pt able to pivot body to sitting EOB with supervision   Transfers Overall transfer level: Needs assistance Equipment used: Straight cane Transfers: Sit to/from Stand Sit to Stand: Min guard         General transfer comment: increased time and effort, minguard for safety     Balance Overall balance assessment: Needs assistance Sitting-balance support: Feet supported Sitting balance-Leahy Scale: Fair Sitting balance - Comments: pt able to sit EOB with supervision   Standing balance support: Single extremity supported Standing balance-Leahy Scale: Fair Standing balance comment: Pt utilizing single UE support during mobility, intermittently reaching out for wall with other UE when maneuvering through room                            ADL either performed or assessed with clinical judgement   ADL Overall ADL's : Needs assistance/impaired Eating/Feeding: Set up;Sitting   Grooming: Min guard;Standing   Upper Body Bathing: Min guard;Sitting   Lower Body Bathing: Min guard;With adaptive equipment;Sitting/lateral leans;Cueing for back precautions   Upper Body Dressing : Moderate assistance;Sitting Upper Body Dressing Details (indicate cue type and reason): to don brace; Pt able to adjust straps to tighten appropriately in sitting Lower Body Dressing: Minimal assistance;With adaptive equipment;Sit to/from stand Lower Body Dressing Details (indicate cue type and reason): Pt verbalized technique for using AE; may benefit from review in next session  Toilet Transfer: Min guard;Ambulation;Grab bars;BSC Toilet Transfer Details (indicate cue type and reason): BSC over toilet; Pt using SPC  Toileting- Clothing Manipulation and Hygiene: Min guard;Sit to/from stand  Functional mobility during ADLs: Min guard;Cane General ADL Comments: reviewed compensatory strategies/techniques for completing ADLs while  adhering to back precautions      Vision Baseline Vision/History: Wears glasses Wears Glasses: Reading only Patient Visual Report: No change from baseline                  Pertinent Vitals/Pain Pain Assessment: Faces Pain Score: 10-Worst pain ever Faces Pain Scale: Hurts little more Pain Location: back Pain Descriptors / Indicators: Sore;Grimacing Pain Intervention(s): Monitored during session;Repositioned;RN gave pain meds during session          Extremity/Trunk Assessment Upper Extremity Assessment Upper Extremity Assessment: Overall WFL for tasks assessed   Lower Extremity Assessment Lower Extremity Assessment: Defer to PT evaluation   Cervical / Trunk Assessment Cervical / Trunk Assessment: Other exceptions Cervical / Trunk Exceptions: s/p recent back surgery and I&D of wound   Communication Communication Communication: No difficulties   Cognition Arousal/Alertness: Awake/alert Behavior During Therapy: WFL for tasks assessed/performed Overall Cognitive Status: Within Functional Limits for tasks assessed                                                      Home Living Family/patient expects to be discharged to:: Private residence Living Arrangements: Spouse/significant other;Children (61 month old twins ) Available Help at Discharge: Family;Available 24 hours/day Type of Home: Mobile home Home Access: Stairs to enter Entrance Stairs-Number of Steps: 3 Entrance Stairs-Rails: Right;Left;Can reach both Home Layout: One level     Bathroom Shower/Tub: Tub/shower unit;Walk-in shower   Bathroom Toilet: Standard Bathroom Accessibility: Yes   Home Equipment: Walker - 2 wheels;Cane - single point;Crutches;Adaptive equipment Adaptive Equipment: Reacher;Sock aid;Long-handled shoe horn        Prior Functioning/Environment Level of Independence: Independent with assistive device(s)  Gait / Transfers Assistance Needed: pt was ambulating  with use of SPC at home ADL's / Homemaking Assistance Needed: Pt was using AE to assist with ADLs, wife reports she often assists with donning socks             OT Problem List: Decreased strength;Decreased range of motion;Decreased activity tolerance;Impaired balance (sitting and/or standing);Decreased knowledge of precautions;Obesity      OT Treatment/Interventions: Self-care/ADL training;DME and/or AE instruction;Therapeutic activities;Patient/family education;Balance training;Therapeutic exercise    OT Goals(Current goals can be found in the care plan section) Acute Rehab OT Goals Patient Stated Goal: return home, decrease pain OT Goal Formulation: With patient/family Time For Goal Achievement: 03/01/17 Potential to Achieve Goals: Good  OT Frequency: Min 2X/week                             AM-PAC PT "6 Clicks" Daily Activity     Outcome Measure Help from another person eating meals?: None Help from another person taking care of personal grooming?: A Little Help from another person toileting, which includes using toliet, bedpan, or urinal?: A Lot Help from another person bathing (including washing, rinsing, drying)?: A Little Help from another person to put on and taking off regular upper body clothing?: A Little Help from another person to put on and taking off regular lower body clothing?: A Lot 6 Click Score: 17   End of Session Equipment Utilized During Treatment: Back brace;Other (comment) [facility] ) Nurse Communication: Mobility status  Activity Tolerance:  Patient tolerated treatment well Patient left: in chair;with call bell/phone within reach;with family/visitor present  OT Visit Diagnosis: Unsteadiness on feet (R26.81);Other abnormalities of gait and mobility (R26.89)                Time: [id number] OT Time Calculation (min): 24 min Charges:  OT General Charges $OT Visit: 1 Procedure OT Evaluation $OT Eval Low Complexity: 1 Procedure G-Codes:      Marcy SirenBreanna Audria Takeshita, OT Pager [phone] 02/15/2017   Orlando PennerBreanna L Ermal Haberer 02/15/2017, 1:29 PM

## 2017-02-15 NOTE — Evaluation (Signed)
Physical Therapy Evaluation Patient Details Name: Sean MuldersBilly Charles Heaton Jr. MRN: [id number] DOB: 1961/06/16 Today's Date: 02/15/2017   History of Present Illness  Pt is a 56 y/o male admitted secondary to increasing back pain and surgical site drainage. Pt with recent T11-L3 posterior lateral fusion surgery on 7/11. Pt is now s/p re-exploration of thoracic lumbar wound with I&D on 8/7. PMH including but not limited to HTN, neuropathy and chronic back pain  Clinical Impression  Pt presented supine in bed with HOB elevated, awake and willing to participate in therapy session. Prior to admission, pt reported that he was ambulating with use of SPC and participating in HHPT. Pt currently requires supervision with bed mobility, supervision for transfers and min guard for safety with ambulation using RW. PT reviewed 3/3 back precautions with pt and pt's spouse; however, pt refusing to perform log roll technique for bed mobility, reporting that it causes increased pain. Pt would continue to benefit from skilled physical therapy services at this time while admitted and after d/c to address the below listed limitations in order to improve overall safety and independence with functional mobility.     Follow Up Recommendations Home health PT;Supervision/Assistance - 24 hour    Equipment Recommendations  None recommended by PT    Recommendations for Other Services       Precautions / Restrictions Precautions Precautions: Back Required Braces or Orthoses:  (MD orders state "no brace needed") Spinal Brace: Thoracolumbosacral orthotic;Applied in sitting position;Other (comment) Spinal Brace Comments: pt had TLSO present in room from his previous surgery and was used during this session Restrictions Weight Bearing Restrictions: No      Mobility  Bed Mobility Overal bed mobility: Needs Assistance Bed Mobility: Supine to Sit     Supine to sit: Supervision     General bed mobility comments: pt  refusing to perform log roll technique for bed mobility, reporting that it was too painful  Transfers Overall transfer level: Needs assistance Equipment used: None Transfers: Sit to/from Stand Sit to Stand: Supervision         General transfer comment: increased time and effort, supervision for safety  Ambulation/Gait Ambulation/Gait assistance: Min guard Ambulation Distance (Feet): 100 Feet Assistive device: Rolling walker (2 wheeled) Gait Pattern/deviations: Step-through pattern;Decreased step length - right;Decreased step length - left;Decreased stride length;Trunk flexed Gait velocity: decreased Gait velocity interpretation: Below normal speed for age/gender General Gait Details: mild instabilitt but no overt LOB or need for physical assistance, min guard for safety; pt maintaining anterior trunk position and unable to correct posture with cueing  Stairs            Wheelchair Mobility    Modified Rankin (Stroke Patients Only)       Balance Overall balance assessment: Needs assistance Sitting-balance support: Feet supported Sitting balance-Leahy Scale: Fair Sitting balance - Comments: pt able to sit EOB with supervision   Standing balance support: During functional activity;No upper extremity supported Standing balance-Leahy Scale: Fair                               Pertinent Vitals/Pain Pain Assessment: 0-10 Pain Score: 10-Worst pain ever Pain Location: back Pain Descriptors / Indicators: Sore;Grimacing Pain Intervention(s): Monitored during session;Repositioned    Home Living Family/patient expects to be discharged to:: Private residence Living Arrangements: Spouse/significant other;Children (5617 month old twins) Available Help at Discharge: Family;Available 24 hours/day Type of Home: Mobile home Home Access: Stairs to enter Entrance  Stairs-Rails: Right;Left;Can reach both Entrance Stairs-Number of Steps: 3 Home Layout: One level Home  Equipment: Walker - 2 wheels;Cane - single point;Crutches;Adaptive equipment      Prior Function Level of Independence: Independent with assistive device(s)   Gait / Transfers Assistance Needed: pt was ambulating with use of SPC at home           Hand Dominance        Extremity/Trunk Assessment   Upper Extremity Assessment Upper Extremity Assessment: Defer to OT evaluation    Lower Extremity Assessment Lower Extremity Assessment: Overall WFL for tasks assessed    Cervical / Trunk Assessment Cervical / Trunk Assessment: Other exceptions Cervical / Trunk Exceptions: s/p recent back surgery and I&D of wound  Communication   Communication: No difficulties  Cognition Arousal/Alertness: Awake/alert Behavior During Therapy: WFL for tasks assessed/performed Overall Cognitive Status: Within Functional Limits for tasks assessed                                        General Comments      Exercises     Assessment/Plan    PT Assessment Patient needs continued PT services  PT Problem List Decreased activity tolerance;Decreased balance;Decreased mobility;Decreased coordination;Decreased knowledge of use of DME;Decreased safety awareness;Decreased knowledge of precautions;Pain       PT Treatment Interventions DME instruction;Gait training;Stair training;Functional mobility training;Therapeutic activities;Neuromuscular re-education;Therapeutic exercise;Balance training;Patient/family education    PT Goals (Current goals can be found in the Care Plan section)  Acute Rehab PT Goals Patient Stated Goal: return home, decrease pain PT Goal Formulation: With patient/family Time For Goal Achievement: 03/01/17 Potential to Achieve Goals: Good    Frequency Min 3X/week   Barriers to discharge        Co-evaluation               AM-PAC PT "6 Clicks" Daily Activity  Outcome Measure Difficulty turning over in bed (including adjusting bedclothes, sheets and  blankets)?: A Little Difficulty moving from lying on back to sitting on the side of the bed? : A Little Difficulty sitting down on and standing up from a chair with arms (e.g., wheelchair, bedside commode, etc,.)?: A Little Help needed moving to and from a bed to chair (including a wheelchair)?: A Little Help needed walking in hospital room?: A Little Help needed climbing 3-5 steps with a railing? : A Little 6 Click Score: 18    End of Session Equipment Utilized During Treatment: Back brace;Gait belt Activity Tolerance: Patient limited by pain Patient left: in chair;with call bell/phone within reach;with family/visitor present Nurse Communication: Mobility status PT Visit Diagnosis: Other abnormalities of gait and mobility (R26.89);Pain Pain - part of body:  (back)    Time: 1047-1106 PT Time Calculation (min) (ACUTE ONLY): 19 min   Charges:   PT Evaluation $PT Eval Moderate Complexity: 1 Mod     PT G Codes:        [place], PT, DPT [phone]   Alessandra Bevels Forney Kleinpeter 02/15/2017, 11:44 AM

## 2017-02-15 NOTE — Progress Notes (Signed)
PT Cancellation Note  Patient Details Name: Sean MuldersBilly Charles Yeh Jr. MRN: [id number] DOB: [birth year]   Cancelled Treatment:    Reason Eval/Treat Not Completed: Patient declined, no reason specified. Pt requesting to wait until he has pain meds. PT will continue to f/u with pt as available.   Alessandra BevelsJennifer M Teanna Elem 02/15/2017, 9:26 AM

## 2017-02-16 LAB — BASIC METABOLIC PANEL
Anion gap: 9 (ref 5–15)
BUN: 5 mg/dL — AB (ref 6–20)
CHLORIDE: 101 mmol/L (ref 101–111)
CO2: 29 mmol/L (ref 22–32)
Calcium: 8.2 mg/dL — ABNORMAL LOW (ref 8.9–10.3)
Creatinine, Ser: 0.59 mg/dL — ABNORMAL LOW (ref 0.61–1.24)
GFR calc Af Amer: >60 mL/min (ref >60)
GFR calc non Af Amer: >60 mL/min (ref >60)
GLUCOSE: 118 mg/dL — AB (ref 65–99)
POTASSIUM: 4.1 mmol/L (ref 3.5–5.1)
Sodium: 139 mmol/L (ref 135–145)

## 2017-02-16 NOTE — Progress Notes (Signed)
PT Progress Note  Late entry due to EPIC system down.    02/16/17 0800  PT Visit Information  Last PT Received On 02/16/17  Assistance Needed +1  History of Present Illness Pt is a 56 y/o male admitted secondary to increasing back pain and surgical site drainage. Pt with recent T11-L3 posterior lateral fusion surgery on 7/11. Pt is now s/p re-exploration of thoracic lumbar wound with I&D on 8/7. PMH including but not limited to HTN, neuropathy and chronic back pain  Precautions  Precautions Back  Spinal Brace TLSO;Applied in sitting position;Other (comment)  Spinal Brace Comments pt had TLSO present in room from his previous surgery and was used during this session  Restrictions  Weight Bearing Restrictions No  Pain Assessment  Pain Assessment Faces  Faces Pain Scale 6  Pain Location back  Pain Descriptors / Indicators Sore;Grimacing  Pain Intervention(s) Monitored during session;Repositioned;Premedicated before session  Cognition  Arousal/Alertness Awake/alert  Behavior During Therapy WFL for tasks assessed/performed  Overall Cognitive Status Within Functional Limits for tasks assessed  Bed Mobility  General bed mobility comments pt sitting EOB upon arrival  Transfers  Overall transfer level Needs assistance  Equipment used None  Transfers Sit to/from BJ'sStand;Stand Pivot Transfers  Sit to Stand Min guard  Stand pivot transfers Min guard  General transfer comment increased time and effort, minguard for safety   Ambulation/Gait  General Gait Details pt only agreeable to transfer from bed to chair secondary to pain  Balance  Overall balance assessment Needs assistance  Sitting-balance support Feet supported  Sitting balance-Leahy Scale Fair  Sitting balance - Comments pt able to sit EOB with supervision  Standing balance support No upper extremity supported  Standing balance-Leahy Scale Fair  PT - End of Session  Activity Tolerance Patient limited by pain  Patient left in  chair;with call bell/phone within reach;with family/visitor present  Nurse Communication Mobility status  PT - Assessment/Plan  PT Plan Current plan remains appropriate  PT Visit Diagnosis Other abnormalities of gait and mobility (R26.89);Pain  Pain - part of body (back)  PT Frequency (ACUTE ONLY) Min 3X/week  Follow Up Recommendations Home health PT;Supervision/Assistance - 24 hour  PT equipment None recommended by PT  AM-PAC PT "6 Clicks" Daily Activity Outcome Measure  Difficulty turning over in bed (including adjusting bedclothes, sheets and blankets)? 3  Difficulty moving from lying on back to sitting on the side of the bed?  3  Difficulty sitting down on and standing up from a chair with arms (e.g., wheelchair, bedside commode, etc,.)? 3  Help needed moving to and from a bed to chair (including a wheelchair)? 3  Help needed walking in hospital room? 3  Help needed climbing 3-5 steps with a railing?  3  6 Click Score 18  Mobility G Code  CK  PT Goal Progression  Progress towards PT goals Progressing toward goals  Acute Rehab PT Goals  PT Goal Formulation With patient/family  Time For Goal Achievement 03/01/17  Potential to Achieve Goals Good  PT Time Calculation  PT Start Time (ACUTE ONLY) 1545  PT Stop Time (ACUTE ONLY) 1621  PT Time Calculation (min) (ACUTE ONLY) 36 min   [place]Jennifer Jazzman [place], [place]PT, [place]PT [phone]

## 2017-02-17 LAB — AEROBIC CULTURE  (SUPERFICIAL SPECIMEN)

## 2017-02-17 LAB — AEROBIC CULTURE W GRAM STAIN (SUPERFICIAL SPECIMEN)

## 2017-02-17 LAB — VANCOMYCIN, TROUGH: Vancomycin Tr: 7 ug/mL — ABNORMAL LOW (ref 15–20)

## 2017-02-17 LAB — GLUCOSE, CAPILLARY: Glucose-Capillary: 122 mg/dL — ABNORMAL HIGH (ref 65–99)

## 2017-02-17 MED ORDER — OXYCODONE HCL 5 MG PO TABS: 5.0000 mg | ORAL_TABLET | ORAL | 0 refills | Status: AC | PRN

## 2017-02-17 MED ORDER — DIAZEPAM 5 MG PO TABS: 5.0000 mg | ORAL_TABLET | Freq: Four times a day (QID) | ORAL | 0 refills | Status: AC | PRN

## 2017-02-17 MED ORDER — VANCOMYCIN HCL IN DEXTROSE 1-5 GM/200ML-% IV SOLN: 1000.0000 mg | Freq: Two times a day (BID) | INTRAVENOUS | 6 refills | Status: DC

## 2017-02-17 MED ORDER — HEPARIN SOD (PORK) LOCK FLUSH 100 UNIT/ML IV SOLN
250.0000 [IU] | INTRAVENOUS | Status: AC | PRN
  Administered 2017-02-17: 250 [IU]

## 2017-02-17 MED ORDER — OXYCODONE HCL ER 30 MG PO T12A: 30.0000 mg | EXTENDED_RELEASE_TABLET | Freq: Two times a day (BID) | ORAL | 0 refills | Status: DC

## 2017-02-17 MED ORDER — VANCOMYCIN IV (FOR PTA / DISCHARGE USE ONLY): 1000.0000 mg | Freq: Two times a day (BID) | INTRAVENOUS | 0 refills | Status: AC

## 2017-02-17 MED ORDER — SODIUM CHLORIDE 0.9% FLUSH
10.0000 mL | INTRAVENOUS | Status: DC | PRN
  Administered 2017-02-17: 10 mL
  Filled 2017-02-17: qty 40

## 2017-02-17 MED ORDER — VANCOMYCIN HCL IN DEXTROSE 750-5 MG/150ML-% IV SOLN
750.0000 mg | Freq: Once | INTRAVENOUS | Status: DC
  Filled 2017-02-17: qty 150

## 2017-02-17 NOTE — Progress Notes (Signed)
[facility]  [facility].  [facility] will provide Home Infusion Pharmacy services for IV Vancomycin at home.  [facility].  Plan for DC home today after trough at 2:30 PM per [facility] and dose at 3 PM.  [facility] will plan for first home dose on 02-18-17 between 8-10 AM.  If patient discharges after hours, please call [phone].   Sedalia Mutaamela S Chandler 02/17/2017, 1:11 PM

## 2017-02-17 NOTE — Care Management Note (Signed)
Case Management Note  Patient Details  Name: Sean MuldersBilly Charles Delaluz Jr. MRN: [id number] Date of Birth: 06/09/1961  Subjective/Objective:                    Action/Plan: Pt discharging home with IV antibiotics. [facility] and [facility]. [facility] notified of [facility]umption of care in [facility] and [facility] antibiotics.  Pam with [facility] IV therapy also notified for the Vancomycin for home. She also did education with the patient and his wife in the room.  Pt discharging with drain. Bedside RN to teach wife how to empty and recharge drain. CM inquired with Morrie SheldonAshley at Dr Lindalou HosePool's office about dressings for the drain. Per Morrie SheldonAshley, Dr Pools says it can stay uncovered. [facility] informed. Per Pam with [facility] Vancomycin orders incorrect for home dosing. CM notified Ashely with Dr Lindal[facility]ose[facility]'s office and awaiting change in orders. Wife providing transportation home.   Expected Discharge Date:  02/17/17               Expected Discharge Plan:  Home w Home Health Services  In-House Referral:     Discharge planning Services  CM Consult  Post Acute Care Choice:  Home Health Choice offered to:  Patient, Spouse  DME Arranged:    DME Agency:     HH Arranged:  RN, PT, OT, IV Antibiotics HH Agency:  [facility]  Status of Service:  Completed, signed off  If discussed at Long Length of Stay Meetings, dates discussed:    Additional Comments:  Kermit BaloKelli F Falecia Vannatter, RN 02/17/2017, 3:41 PM

## 2017-02-17 NOTE — Progress Notes (Signed)
Peripherally Inserted Central Catheter/Midline Placement  The IV Nurse has discussed with the patient and/or persons authorized to consent for the patient, the purpose of this procedure and the potential benefits and risks involved with this procedure.  The benefits include less needle sticks, lab draws from the catheter, and the patient may be discharged home with the catheter. Risks include, but not limited to, infection, bleeding, blood clot (thrombus formation), and puncture of an artery; nerve damage and irregular heartbeat and possibility to perform a PICC exchange if needed/ordered by physician.  Alternatives to this procedure were also discussed.  Bard Power PICC patient education guide, fact sheet on infection prevention and patient information card has been provided to patient /or left at bedside.    PICC/Midline Placement Documentation        Timmothy Soursewman, Rayhana Slider Renee 02/17/2017, 10:49 AM

## 2017-02-17 NOTE — Progress Notes (Signed)
PT Cancellation Note  Patient Details Name: Sean MuldersBilly Charles Derocher Jr. MRN: [id number] DOB: [birth year]   Cancelled Treatment:    Reason Eval/Treat Not Completed: Patient at procedure or test/unavailable. Pt having PICC placed at this time. PT will continue to f/u with pt as available.   Alessandra BevelsJennifer M Cynara Tatham 02/17/2017, 10:44 AM

## 2017-02-17 NOTE — Progress Notes (Addendum)
PHARMACY CONSULT NOTE FOR: Vancomycin  OUTPATIENT  PARENTERAL ANTIBIOTIC THERAPY (OPAT)  Indication: MRSA lumbar wound infection Regimen: currently Vancomycin 1000 mg IV q12 hours.  End date: 03/28/17  IV antibiotic discharge orders are pended. To discharging provider:  please sign these orders via discharge navigator,  Select New Orders & click on the button choice - Manage This Unsigned Work.   (MD has already addressed pending orders).  Pending vancomycin trough due @ 14:30 today. Patient to be discharged after today's 15:00 dose. [facility] pharmacist will follow up on vancomycin trough result and our dosing recommendations and adjust outpatient vancomycin dosage if needed.  Goal Vancomycin Trough = 15-20 mcg/ml  Thank you for allowing pharmacy to be a part of this patient's care.  Noah Delaineuth Nalin Mazzocco, RPh Clinical Pharmacist Pager: [phone] 02/17/2017, earlier today @ 12:45 PM  ADDENDUM:  Vanc Trough = 7 mcg/ml on 1gm IV q12h , steady state level on vanc since 02/14/17.  This is a 56 y.o , obese male. Weight = 124.3 kg SCr = 0.59,  Estimated CrCl >100 ml/min Vancomycin trough drawn appropriately ~36 minutes prior to next dose. Pharmacokinetic parameters calculated: ke : 0.085, t1/2: 8.15h , Vd =86.8 L or 0.7 L/kg  Plan:  Needs increased vancomycin dose to  1500mg  IV q8h to target trough 15-5720mcg/ml. Pt going home after current Vanc 1g dose, currently infusing (given @ 15:42.  [facility] to give vanc dose @8am  tomorrow 02/18/17.  -We wiill give 750mg  x1 now (after 1g dose infused) prior to discharge then I recommend 1500mg  IV q8 hour, starting ~ 8am tomorrow per [facility].  As discussed earlier with Pam from [facility] , I will leave the outpatient discharge vancomycin dose as is for now and [facility] will f/u our notes/levels and adjust vancomycin dose accordingly.   Thank you for allowing pharmacy to be part of this patients care team. Noah Delaineuth Yenesis Even, RPh Clinical Pharmacist Pager: [phone] Main Pharmacy  @[phone] 

## 2017-02-17 NOTE — Progress Notes (Signed)
Cultures revealed MRSA infection. Plan 6 weeks IV vancomycin course. Place PICC line today. Plan for discharge with drain in place. Arrange home antibiotic therapy.

## 2017-02-17 NOTE — Discharge Instructions (Signed)

## 2017-02-17 NOTE — Discharge Summary (Addendum)
Physician Discharge Summary  Patient ID: Sean Barber. MRN: [id number] DOB/AGE: March 24, 1961 56 y.o.  Admit date: 02/13/2017 Discharge date: 02/17/2017  Admission Diagnoses:  Discharge Diagnoses:  Active Problems:   Back pain   Discharged Condition: fair  Hospital Course: patient admitted ital for evaluation of possible postoperative infection. Patient taken the opative room where his wound was irrigated and debrided. Cultures arensistent with MRSA. Will undergo treatment with IV vancomycin for 6 weeks. He will be discharged home with a drain in a superficial w Overall patient's pain level is reasonably controlled. Still having some right lower extremity r chronic since the time of his initial surgery.  Consults:   Significant Diagnostic Studies:   Treatments:   Discharge Exam: Blood pressure 125/78, pulse 92, temperature 97.7 F (36.5 C), temperature source Oral, resp. rate 17, height 5\' 8"  (1.727 m), weight 124.3 kg (274 lb), SpO2 94 %. Awake and alert. Oriented and appropriate. Speech fluent. Judgment and insight intact.cranial nerve function intact bilaterally bilaterally. Motor and sensory examination of the extremities normal. Wound clean and dry. Chest and abdomen benign.  Disposition: 06-Home-Health Care Svc  Discharge Instructions    Care order/instruction    Complete by:  As directed    Please teach drain care. Patient to be discharged with drain in place for 1 week.   Face-to-face encounter (required for Medicare/Medicaid patients)    Complete by:  As directed    I Harryette Shuart A certify that this patient is under my care and that I, or a nurse practitioner or physician's assistant working with me, had a face-to-face encounter that meets the physician face-to-face encounter requirements with this patient on 02/17/2017. The encounter with the patient was in whole, or in part for the following medical condition(s) which is the primary reason for home health care (List  medical condition): lumbar wound infection   The encounter with the patient was in whole, or in part, for the following medical condition, which is the primary reason for home health care:  lumbar wound infection   I certify that, based on my findings, the following services are medically necessary home health services:   Nursing Physical therapy     Reason for Medically Necessary Home Health Services:  Skilled Nursing- Assessment and Training for Infusion Therapy, Line Care, and Infection Control   My clinical findings support the need for the above services:  Pain interferes with ambulation/mobility   Further, I certify that my clinical findings support that this patient is homebound due to:  Pain interferes with ambulation/mobility   Home Health    Complete by:  As directed    To provide the following care/treatments:   PT OT RN       Allergies as of 02/17/2017      Reactions   Penicillins Other (See Comments)   UNKNOWN REACTION Has patient had a PCN reaction causing immediate rash, facial/tongue/throat swelling, SOB or lightheadedness with hypotension: Unknown Has patient had a PCN reaction causing severe rash involving mucus membranes or skin necrosis: No Has patient had a PCN reaction that required hospitalization: No Has patient had a PCN reaction occurring within the last 10 years:No If all of the above answers are "NO", then may proceed with Cephalosporin use.      Medication List    TAKE these medications   aspirin 325 MG EC tablet Take 325 mg by mouth daily.   diazepam 5 MG tablet Commonly known as:  VALIUM Take 1-2 tablets (5-10 mg total) by  mouth every 6 (six) hours as needed for muscle spasms. What changed:  how much to take   etodolac 500 MG tablet Commonly known as:  LODINE Take 500 mg by mouth 2 (two) times daily.   gabapentin 600 MG tablet Commonly known as:  NEURONTIN Take 600-1,200 mg by mouth 3 (three) times daily. 600 mg in AM and Afternoon, 1200 mg at  bedtime   Garlic 1000 MG Caps Take 1,000 mg by mouth daily.   multivitamin with minerals Tabs tablet Take 1 tablet by mouth daily.   omega-3 acid ethyl esters 1 g capsule Commonly known as:  LOVAZA Take 1 g by mouth daily.   omeprazole 40 MG capsule Commonly known as:  PRILOSEC Take 40 mg by mouth 2 (two) times daily.   oxyCODONE 5 MG immediate release tablet Commonly known as:  Oxy IR/ROXICODONE Take 1-2 tablets (5-10 mg total) by mouth every 3 (three) hours as needed for breakthrough pain.   oxyCODONE 30 MG 12 hr tablet Commonly known as:  OXYCONTIN Take 30 mg by mouth 2 (two) times daily.   vancomycin 1-5 GM/200ML-% Soln Commonly known as:  VANCOCIN Inject 200 mLs (1,000 mg total) into the vein every 12 (twelve) hours.      Follow-up Information    Julio SicksPool, Abbe Bula, MD. Schedule an appointment as soon as possible for a visit on 03/01/2017.   Specialty:  Neurosurgery Contact information: [address] [place] [address] [phone]           Signed: Temple PaciniOOL,Jazel Nimmons A 02/17/2017, 9:47 AM

## 2017-02-17 NOTE — Progress Notes (Signed)
[facility]  Pt will DC home tonight. Pt has received all doses needed for today. [facility] RN will make ROC 8-10 am for next dose at home.  Pt and wife are aware that pt will not need any more doses of IV ABX today and next dose at 8-10 tomorrow,02-18-17.  If patient discharges after hours, please call [phone].   Sedalia Mutaamela S Chandler 02/17/2017, 5:21 PM

## 2017-02-17 NOTE — Progress Notes (Signed)
Pt being discharged per orders from MD. Pt and spouse educated on discharge instructions. Pt and spouse verbalized understanding of instructions. All questions and concerns were addressed. Pt's peripheral IV was removed prior to discharge. Pt exited hospital via wheelchair.

## 2017-02-17 NOTE — Anesthesia Postprocedure Evaluation (Signed)
Anesthesia Post Note  Patient: Sean MuldersBilly Charles Morfin Jr.  Procedure(s) Performed: Procedure(s) (LRB): LUMBAR WOUND DEBRIDEMENT (N/A)     Patient location during evaluation: PACU Anesthesia Type: General Level of consciousness: awake and alert Pain management: pain level controlled Vital Signs Assessment: post-procedure vital signs reviewed and stable Respiratory status: spontaneous breathing, nonlabored ventilation, respiratory function stable and patient connected to nasal cannula oxygen Cardiovascular status: blood pressure returned to baseline and stable Postop Assessment: no signs of nausea or vomiting Anesthetic complications: no    Last Vitals:  Vitals:   02/17/17 0439 02/17/17 0912  BP: 111/68 125/78  Pulse: 85 92  Resp: 18 17  Temp: 36.7 C 36.5 C  SpO2: 94% 94%    Last Pain:  Vitals:   02/17/17 1117  TempSrc:   PainSc: 9                  Namiah Dunnavant

## 2017-02-17 NOTE — Progress Notes (Addendum)
Occupational Therapy Treatment Patient Details Name: Sean Barber. MRN: [id number] DOB: 12-20-1960 Today's Date: 02/17/2017    History of present illness Pt is a 56 y/o male admitted secondary to increasing back pain and surgical site drainage. Pt with recent T11-L3 posterior lateral fusion surgery on 7/11. Pt is now s/p re-exploration of thoracic lumbar wound with I&D on 8/7. PMH including but not limited to HTN, neuropathy and chronic back pain   OT comments  Pt. Able to complete bed mobility, return demo of use of A/E and transfer to recliner.  Wife also present for session and both report they feel comfortable with his current level of function.  Report he will have assistance with ADLs as needed.  No further questions or concerns.  Clear for d/c from acute OT.  Will alert OTR/L to sign off.    Follow Up Recommendations  No OT follow up;Supervision/Assistance - 24 hour    Equipment Recommendations  None recommended by OT    Recommendations for Other Services      Precautions / Restrictions Precautions Precautions: Back Spinal Brace: Thoracolumbosacral orthotic;Applied in sitting position;Other (comment) (reporting brace is not required "all the time") Spinal Brace Comments: pt had TLSO present in room from his previous surgery, pt. and wife state he does not have to wear it "all the time"       Mobility Bed Mobility Overal bed mobility: Needs Assistance Bed Mobility: Supine to Sit     Supine to sit: Supervision     General bed mobility comments: hob slightly elevated, exited from R side of bed  Transfers Overall transfer level: Needs assistance Equipment used: Straight cane Transfers: Sit to/from Stand;Stand Pivot Transfers Sit to Stand: Min guard Stand pivot transfers: Min guard            Balance                                           ADL either performed or assessed with clinical judgement   ADL Overall ADL's : Needs  assistance/impaired               Lower Body Bathing Details (indicate cue type and reason): pt. and spouse report having the A/E kit from recent sx. decline need for review     Lower Body Dressing: Set up;With adaptive equipment;Sitting/lateral leans Lower Body Dressing Details (indicate cue type and reason): pt. able to cross L leg over knee to don sock, used sock aide and reacher for donning R sock.    Toilet Transfer Details (indicate cue type and reason): pt. and spouse report he has been amb. to/from b.room without any issues. declined need for review this day       Tub/Shower Transfer Details (indicate cue type and reason): wife reports he has shower chair and is still able to step over tub without any issues   General ADL Comments: reviewed compensatory strategies/techniques for completing ADLs while adhering to back precautions. pt. and spouse both verbalize confidence in all ADLS especially with previous sx. being so recent.  deny need for any further review.  agree to acute OT signing off.  no other questions or concerns     Vision       Perception     Praxis      Cognition Arousal/Alertness: Lethargic Behavior During Therapy: WFL for tasks assessed/performed Overall Cognitive Status: Within Functional  Limits for tasks assessed                                          Exercises     Shoulder Instructions       General Comments      Pertinent Vitals/ Pain       Pain Assessment: No/denies pain Pain Intervention(s): Premedicated before session  Home Living                                          Prior Functioning/Environment              Frequency  Min 2X/week        Progress Toward Goals  OT Goals(current goals can now be found in the care plan section)  Progress towards OT goals: Goals met/education completed, patient discharged from [place] Discharge plan remains appropriate     Co-evaluation                 AM-PAC PT "6 Clicks" Daily Activity     Outcome Measure   Help from another person eating meals?: None Help from another person taking care of personal grooming?: A Little Help from another person toileting, which includes using toliet, bedpan, or urinal?: A Lot Help from another person bathing (including washing, rinsing, drying)?: A Little Help from another person to put on and taking off regular upper body clothing?: A Little Help from another person to put on and taking off regular lower body clothing?: A Lot 6 Click Score: 17    End of Session Equipment Utilized During Treatment: Gait belt;Other (comment) (cane)  OT Visit Diagnosis: Unsteadiness on feet (R26.81);Other abnormalities of gait and mobility (R26.89) Pain - Right/Left: Right Pain - part of body: Leg   Activity Tolerance Patient tolerated treatment well   Patient Left in chair;with call bell/phone within reach;with family/visitor present   Nurse Communication          Time: 2446-9507 OT Time Calculation (min): 24 min  Charges: OT General Charges $OT Visit: 1 Procedure OT Treatments $Self Care/Home Management : 23-37 mins   Janice Coffin, COTA/L 02/17/2017, 9:38 AM

## 2017-02-20 DIAGNOSIS — I10 Essential (primary) hypertension: Secondary | ICD-10-CM | POA: Diagnosis not present

## 2017-02-20 DIAGNOSIS — S32010D Wedge compression fracture of first lumbar vertebra, subsequent encounter for fracture with routine healing: Secondary | ICD-10-CM | POA: Diagnosis not present

## 2017-02-23 DIAGNOSIS — T814XXA Infection following a procedure, initial encounter: Secondary | ICD-10-CM | POA: Diagnosis not present

## 2017-02-23 DIAGNOSIS — B9562 Methicillin resistant Staphylococcus aureus infection as the cause of diseases classified elsewhere: Secondary | ICD-10-CM | POA: Diagnosis not present

## 2017-03-02 DIAGNOSIS — B9562 Methicillin resistant Staphylococcus aureus infection as the cause of diseases classified elsewhere: Secondary | ICD-10-CM | POA: Diagnosis not present

## 2017-03-09 DIAGNOSIS — T814XXA Infection following a procedure, initial encounter: Secondary | ICD-10-CM | POA: Diagnosis not present

## 2017-03-09 DIAGNOSIS — B9562 Methicillin resistant Staphylococcus aureus infection as the cause of diseases classified elsewhere: Secondary | ICD-10-CM | POA: Diagnosis not present

## 2017-03-10 DIAGNOSIS — M5417 Radiculopathy, lumbosacral region: Secondary | ICD-10-CM | POA: Diagnosis not present

## 2017-03-10 DIAGNOSIS — Z79899 Other long term (current) drug therapy: Secondary | ICD-10-CM | POA: Diagnosis not present

## 2017-03-10 DIAGNOSIS — M5441 Lumbago with sciatica, right side: Secondary | ICD-10-CM | POA: Diagnosis not present

## 2017-03-10 DIAGNOSIS — Z5181 Encounter for therapeutic drug level monitoring: Secondary | ICD-10-CM | POA: Diagnosis not present

## 2017-03-21 DIAGNOSIS — E119 Type 2 diabetes mellitus without complications: Secondary | ICD-10-CM | POA: Diagnosis not present

## 2017-03-28 DIAGNOSIS — M1990 Unspecified osteoarthritis, unspecified site: Secondary | ICD-10-CM | POA: Diagnosis not present

## 2017-03-28 DIAGNOSIS — G8929 Other chronic pain: Secondary | ICD-10-CM | POA: Diagnosis not present

## 2017-03-28 DIAGNOSIS — T814XXA Infection following a procedure, initial encounter: Secondary | ICD-10-CM | POA: Diagnosis not present

## 2017-03-28 DIAGNOSIS — Z792 Long term (current) use of antibiotics: Secondary | ICD-10-CM | POA: Diagnosis not present

## 2017-03-28 DIAGNOSIS — G9009 Other idiopathic peripheral autonomic neuropathy: Secondary | ICD-10-CM | POA: Diagnosis not present

## 2017-03-28 DIAGNOSIS — S32010D Wedge compression fracture of first lumbar vertebra, subsequent encounter for fracture with routine healing: Secondary | ICD-10-CM | POA: Diagnosis not present

## 2017-03-28 DIAGNOSIS — A4902 Methicillin resistant Staphylococcus aureus infection, unspecified site: Secondary | ICD-10-CM | POA: Diagnosis not present

## 2017-03-28 DIAGNOSIS — I10 Essential (primary) hypertension: Secondary | ICD-10-CM | POA: Diagnosis not present

## 2017-03-28 DIAGNOSIS — F17210 Nicotine dependence, cigarettes, uncomplicated: Secondary | ICD-10-CM | POA: Diagnosis not present

## 2017-03-28 DIAGNOSIS — Z452 Encounter for adjustment and management of vascular access device: Secondary | ICD-10-CM | POA: Diagnosis not present

## 2017-04-03 DIAGNOSIS — M5417 Radiculopathy, lumbosacral region: Secondary | ICD-10-CM | POA: Diagnosis not present

## 2017-04-03 DIAGNOSIS — M5441 Lumbago with sciatica, right side: Secondary | ICD-10-CM | POA: Diagnosis not present

## 2017-04-03 DIAGNOSIS — G894 Chronic pain syndrome: Secondary | ICD-10-CM | POA: Diagnosis not present

## 2017-04-03 DIAGNOSIS — M961 Postlaminectomy syndrome, not elsewhere classified: Secondary | ICD-10-CM | POA: Diagnosis not present

## 2017-04-18 DIAGNOSIS — M5126 Other intervertebral disc displacement, lumbar region: Secondary | ICD-10-CM | POA: Diagnosis not present

## 2017-04-24 DIAGNOSIS — I10 Essential (primary) hypertension: Secondary | ICD-10-CM | POA: Diagnosis not present

## 2017-04-24 DIAGNOSIS — Z6841 Body Mass Index (BMI) 40.0 and over, adult: Secondary | ICD-10-CM | POA: Diagnosis not present

## 2017-04-24 DIAGNOSIS — S32010A Wedge compression fracture of first lumbar vertebra, initial encounter for closed fracture: Secondary | ICD-10-CM | POA: Diagnosis not present

## 2017-05-03 DIAGNOSIS — M5441 Lumbago with sciatica, right side: Secondary | ICD-10-CM | POA: Diagnosis not present

## 2017-05-03 DIAGNOSIS — M5417 Radiculopathy, lumbosacral region: Secondary | ICD-10-CM | POA: Diagnosis not present

## 2017-05-03 DIAGNOSIS — S32000S Wedge compression fracture of unspecified lumbar vertebra, sequela: Secondary | ICD-10-CM | POA: Diagnosis not present

## 2017-05-03 DIAGNOSIS — Z79899 Other long term (current) drug therapy: Secondary | ICD-10-CM | POA: Diagnosis not present

## 2017-05-04 DIAGNOSIS — E118 Type 2 diabetes mellitus with unspecified complications: Secondary | ICD-10-CM | POA: Diagnosis not present

## 2017-05-24 DIAGNOSIS — Z7982 Long term (current) use of aspirin: Secondary | ICD-10-CM | POA: Diagnosis not present

## 2017-05-24 DIAGNOSIS — R7989 Other specified abnormal findings of blood chemistry: Secondary | ICD-10-CM | POA: Diagnosis not present

## 2017-05-24 DIAGNOSIS — R0609 Other forms of dyspnea: Secondary | ICD-10-CM | POA: Diagnosis not present

## 2017-05-24 DIAGNOSIS — Z880 Allergy status to penicillin: Secondary | ICD-10-CM | POA: Diagnosis not present

## 2017-05-24 DIAGNOSIS — Z720 Tobacco use: Secondary | ICD-10-CM | POA: Diagnosis not present

## 2017-05-24 DIAGNOSIS — E785 Hyperlipidemia, unspecified: Secondary | ICD-10-CM | POA: Diagnosis not present

## 2017-05-24 DIAGNOSIS — R51 Headache: Secondary | ICD-10-CM | POA: Diagnosis not present

## 2017-05-24 DIAGNOSIS — Z79899 Other long term (current) drug therapy: Secondary | ICD-10-CM | POA: Diagnosis not present

## 2017-05-24 DIAGNOSIS — I10 Essential (primary) hypertension: Secondary | ICD-10-CM | POA: Diagnosis not present

## 2017-05-24 DIAGNOSIS — F17210 Nicotine dependence, cigarettes, uncomplicated: Secondary | ICD-10-CM | POA: Diagnosis not present

## 2017-05-24 DIAGNOSIS — E669 Obesity, unspecified: Secondary | ICD-10-CM | POA: Diagnosis not present

## 2017-05-24 DIAGNOSIS — R0602 Shortness of breath: Secondary | ICD-10-CM | POA: Diagnosis not present

## 2017-05-24 DIAGNOSIS — I5032 Chronic diastolic (congestive) heart failure: Secondary | ICD-10-CM | POA: Diagnosis not present

## 2017-05-24 DIAGNOSIS — G894 Chronic pain syndrome: Secondary | ICD-10-CM

## 2017-05-24 DIAGNOSIS — R079 Chest pain, unspecified: Secondary | ICD-10-CM

## 2017-05-24 DIAGNOSIS — I110 Hypertensive heart disease with heart failure: Secondary | ICD-10-CM | POA: Diagnosis not present

## 2017-05-24 DIAGNOSIS — K219 Gastro-esophageal reflux disease without esophagitis: Secondary | ICD-10-CM | POA: Diagnosis not present

## 2017-05-25 DIAGNOSIS — R0609 Other forms of dyspnea: Secondary | ICD-10-CM | POA: Diagnosis not present

## 2017-05-25 DIAGNOSIS — Z720 Tobacco use: Secondary | ICD-10-CM | POA: Diagnosis not present

## 2017-05-25 DIAGNOSIS — R079 Chest pain, unspecified: Secondary | ICD-10-CM | POA: Diagnosis not present

## 2017-05-25 DIAGNOSIS — G894 Chronic pain syndrome: Secondary | ICD-10-CM | POA: Diagnosis not present

## 2017-05-25 DIAGNOSIS — E785 Hyperlipidemia, unspecified: Secondary | ICD-10-CM | POA: Diagnosis not present

## 2017-05-31 DIAGNOSIS — M961 Postlaminectomy syndrome, not elsewhere classified: Secondary | ICD-10-CM | POA: Diagnosis not present

## 2017-05-31 DIAGNOSIS — M5441 Lumbago with sciatica, right side: Secondary | ICD-10-CM | POA: Diagnosis not present

## 2017-05-31 DIAGNOSIS — M5417 Radiculopathy, lumbosacral region: Secondary | ICD-10-CM | POA: Diagnosis not present

## 2017-05-31 DIAGNOSIS — G894 Chronic pain syndrome: Secondary | ICD-10-CM | POA: Diagnosis not present

## 2017-06-23 DIAGNOSIS — Z716 Tobacco abuse counseling: Secondary | ICD-10-CM | POA: Diagnosis not present

## 2017-06-23 DIAGNOSIS — Z23 Encounter for immunization: Secondary | ICD-10-CM | POA: Diagnosis not present

## 2017-06-23 DIAGNOSIS — I10 Essential (primary) hypertension: Secondary | ICD-10-CM | POA: Diagnosis not present

## 2017-06-26 DIAGNOSIS — S32010A Wedge compression fracture of first lumbar vertebra, initial encounter for closed fracture: Secondary | ICD-10-CM | POA: Diagnosis not present

## 2017-06-29 ENCOUNTER — Other Ambulatory Visit: Payer: Self-pay | Admitting: Neurosurgery

## 2017-06-29 ENCOUNTER — Other Ambulatory Visit (HOSPITAL_COMMUNITY): Payer: Self-pay | Admitting: Neurosurgery

## 2017-06-29 DIAGNOSIS — S32010A Wedge compression fracture of first lumbar vertebra, initial encounter for closed fracture: Secondary | ICD-10-CM

## 2017-06-30 DIAGNOSIS — M5441 Lumbago with sciatica, right side: Secondary | ICD-10-CM | POA: Diagnosis not present

## 2017-06-30 DIAGNOSIS — S32000S Wedge compression fracture of unspecified lumbar vertebra, sequela: Secondary | ICD-10-CM | POA: Diagnosis not present

## 2017-06-30 DIAGNOSIS — Z716 Tobacco abuse counseling: Secondary | ICD-10-CM | POA: Diagnosis not present

## 2017-06-30 DIAGNOSIS — M5417 Radiculopathy, lumbosacral region: Secondary | ICD-10-CM | POA: Diagnosis not present

## 2017-07-14 ENCOUNTER — Encounter (HOSPITAL_COMMUNITY): Payer: Self-pay

## 2017-07-14 ENCOUNTER — Ambulatory Visit (HOSPITAL_COMMUNITY)
Admission: RE | Admit: 2017-07-14 | Discharge: 2017-07-14 | Disposition: A | Discharge status: Home / Self Care | Payer: Medicare Other | Source: Ambulatory Visit | Attending: Neurosurgery | Admitting: Neurosurgery

## 2017-07-14 ENCOUNTER — Ambulatory Visit (HOSPITAL_COMMUNITY): Payer: Medicare Other

## 2017-07-14 DIAGNOSIS — S32010A Wedge compression fracture of first lumbar vertebra, initial encounter for closed fracture: Secondary | ICD-10-CM

## 2017-07-14 DIAGNOSIS — M4856XA Collapsed vertebra, not elsewhere classified, lumbar region, initial encounter for fracture: Secondary | ICD-10-CM | POA: Insufficient documentation

## 2017-07-14 MED ORDER — DIAZEPAM 5 MG PO TABS
10.0000 mg | ORAL_TABLET | Freq: Once | ORAL | Status: AC
  Administered 2017-07-14: 10 mg via ORAL

## 2017-07-14 MED ORDER — IOPAMIDOL (ISOVUE-M 200) INJECTION 41%: 20.0000 mL | Freq: Once | INTRAMUSCULAR | Status: DC

## 2017-07-14 MED ORDER — DIAZEPAM 5 MG PO TABS
ORAL_TABLET | ORAL | Status: AC
  Administered 2017-07-14: 10 mg via ORAL
  Filled 2017-07-14: qty 2

## 2017-07-14 MED ORDER — LIDOCAINE HCL (PF) 1 % IJ SOLN: 5.0000 mL | Freq: Once | INTRAMUSCULAR | Status: DC

## 2017-07-14 NOTE — Progress Notes (Signed)
Spoke with Dr. Franky Machoabbell regarding pt having 4 teeth pulled yesterday, antibiotic ordered but pt did not have time to pick up so did not take. Dr. Franky Machoabbell cancelled myelogram for today. Pt verbalized understanding. Pt may DC home at 0815 since 10 mg PO Valium was given. [name] is with pt and is driving.

## 2017-07-17 ENCOUNTER — Other Ambulatory Visit (HOSPITAL_COMMUNITY): Payer: Self-pay | Admitting: Neurosurgery

## 2017-07-17 DIAGNOSIS — S32010A Wedge compression fracture of first lumbar vertebra, initial encounter for closed fracture: Secondary | ICD-10-CM

## 2017-07-26 ENCOUNTER — Ambulatory Visit (HOSPITAL_COMMUNITY)
Admission: RE | Admit: 2017-07-26 | Discharge: 2017-07-26 | Disposition: A | Discharge status: Home / Self Care | Payer: Medicare Other | Source: Ambulatory Visit | Attending: Neurosurgery | Admitting: Neurosurgery

## 2017-07-26 DIAGNOSIS — S32010A Wedge compression fracture of first lumbar vertebra, initial encounter for closed fracture: Secondary | ICD-10-CM

## 2017-07-26 DIAGNOSIS — M545 Low back pain: Secondary | ICD-10-CM | POA: Diagnosis not present

## 2017-07-26 DIAGNOSIS — M5116 Intervertebral disc disorders with radiculopathy, lumbar region: Secondary | ICD-10-CM | POA: Diagnosis not present

## 2017-07-26 DIAGNOSIS — X58XXXA Exposure to other specified factors, initial encounter: Secondary | ICD-10-CM | POA: Insufficient documentation

## 2017-07-26 MED ORDER — OXYCODONE HCL 5 MG PO TABS
5.0000 mg | ORAL_TABLET | ORAL | Status: DC | PRN
  Administered 2017-07-26: 10 mg via ORAL

## 2017-07-26 MED ORDER — IOPAMIDOL (ISOVUE-M 200) INJECTION 41%
20.0000 mL | Freq: Once | INTRAMUSCULAR | Status: AC
  Administered 2017-07-26: 20 mL via INTRATHECAL

## 2017-07-26 MED ORDER — OXYCODONE HCL 5 MG PO TABS: 5.0000 mg | ORAL_TABLET | ORAL | Status: DC | PRN

## 2017-07-26 MED ORDER — DIAZEPAM 5 MG PO TABS
ORAL_TABLET | ORAL | Status: AC
  Filled 2017-07-26: qty 2

## 2017-07-26 MED ORDER — LIDOCAINE HCL (PF) 1 % IJ SOLN
5.0000 mL | Freq: Once | INTRAMUSCULAR | Status: AC
  Administered 2017-07-26: 5 mL via INTRADERMAL

## 2017-07-26 MED ORDER — ONDANSETRON HCL 4 MG/2ML IJ SOLN: 4.0000 mg | Freq: Four times a day (QID) | INTRAMUSCULAR | Status: DC | PRN

## 2017-07-26 MED ORDER — OXYCODONE HCL 5 MG PO TABS
ORAL_TABLET | ORAL | Status: AC
  Filled 2017-07-26: qty 2

## 2017-07-26 MED ORDER — LIDOCAINE HCL (PF) 1 % IJ SOLN
INTRAMUSCULAR | Status: AC
  Administered 2017-07-26: 5 mL via INTRADERMAL
  Filled 2017-07-26: qty 5

## 2017-07-26 MED ORDER — IOPAMIDOL (ISOVUE-M 200) INJECTION 41%
INTRAMUSCULAR | Status: AC
  Administered 2017-07-26: 20 mL via INTRATHECAL
  Filled 2017-07-26: qty 10

## 2017-07-26 MED ORDER — DIAZEPAM 5 MG PO TABS
10.0000 mg | ORAL_TABLET | Freq: Once | ORAL | Status: AC
  Administered 2017-07-26: 10 mg via ORAL

## 2017-07-26 NOTE — Discharge Instructions (Signed)
Myelogram, Care After °Refer to this sheet in the next few weeks. These instructions provide you with information about caring for yourself after your procedure. Your health care provider may also give you more specific instructions. Your treatment has been planned according to current medical practices, but problems sometimes occur. Call your health care provider if you have any problems or questions after your procedure. °What can I expect after the procedure? °After the procedure, it is common to have: °· Soreness at your injection site. °· A mild headache. ° °Follow these instructions at home: °· Drink enough fluid to keep your urine clear or pale yellow. This will help flush out the dye (contrast material) from your spine. °· Rest as told by your health care provider. Lie flat with your head slightly raised (elevated) to reduce the risk of headache. °· Do not bend, lift, or do any strenuous activity for 24-48 hours or as told by your health care provider. °· Take over-the-counter and prescription medicines only as told by your health care provider. °· Take care of and remove your bandage (dressing) as told by your health care provider. °· Bathe or shower as told by your health care provider. °Contact a health care provider if: °· You have a fever. °· You have a headache that lasts longer than 24 hours. °· You feel nauseous or vomit. °· You have a stiff neck or numbness in your legs. °· You are unable to urinate or have a bowel movement. °· You develop a rash, itching, or sneezing. °Get help right away if: °· You have new symptoms or your symptoms get worse. °· You have a seizure. °· You have trouble breathing. °This information is not intended to replace advice given to you by your health care provider. Make sure you discuss any questions you have with your health care provider. °Document Released: 07/24/2015 Document Revised: 12/03/2015 Document Reviewed: 04/09/2015 °Elsevier Interactive Patient Education ©  2018 [employer] ° °

## 2017-07-26 NOTE — Op Note (Signed)
07/26/2017 lumbar Myelogram  PATIENT:  Sean MuldersBilly Charles Wolken Jr. is a 57 y.o. male with pain in the lower back and right lower extremity.  PRE-OPERATIVE DIAGNOSIS:  Right lower extremity pain, back pain  POST-OPERATIVE DIAGNOSIS:  same  PROCEDURE:  Lumbar Myelogram  SURGEON:  Myson Levi  ANESTHESIA:   local LOCAL MEDICATIONS USED:  LIDOCAINE  Procedure Note: Sean MuldersBilly Charles Bubb Jr. is a 57 y.o. male Was taken to the fluoroscopy suite and  positioned prone on the fluoroscopy table. His back was prepared and draped in a sterile manner. I infiltrated 9 cc into the lumbar region. I then introduced a spinal needle into the thecal sac at the L4/5 interlaminar space. I infiltrated 20cc of Isovue 200 into the thecal sac. Fluoroscopy showed the needle and contrast in the thecal sac. Sean TechnologyBilly Charles Weist Jr. tolerated the procedure well. he Will be taken to CT for evaluation.     PATIENT DISPOSITION:  Short Stay

## 2017-08-03 DIAGNOSIS — S32010A Wedge compression fracture of first lumbar vertebra, initial encounter for closed fracture: Secondary | ICD-10-CM | POA: Diagnosis not present

## 2017-08-03 DIAGNOSIS — Z6841 Body Mass Index (BMI) 40.0 and over, adult: Secondary | ICD-10-CM | POA: Diagnosis not present

## 2017-08-03 DIAGNOSIS — M5417 Radiculopathy, lumbosacral region: Secondary | ICD-10-CM | POA: Diagnosis not present

## 2017-08-03 DIAGNOSIS — I10 Essential (primary) hypertension: Secondary | ICD-10-CM | POA: Diagnosis not present

## 2017-08-08 DIAGNOSIS — M5417 Radiculopathy, lumbosacral region: Secondary | ICD-10-CM | POA: Diagnosis not present

## 2017-08-08 DIAGNOSIS — M5441 Lumbago with sciatica, right side: Secondary | ICD-10-CM | POA: Diagnosis not present

## 2017-08-08 DIAGNOSIS — G894 Chronic pain syndrome: Secondary | ICD-10-CM | POA: Diagnosis not present

## 2017-08-08 DIAGNOSIS — M961 Postlaminectomy syndrome, not elsewhere classified: Secondary | ICD-10-CM | POA: Diagnosis not present

## 2017-09-04 DIAGNOSIS — G894 Chronic pain syndrome: Secondary | ICD-10-CM | POA: Diagnosis not present

## 2017-09-04 DIAGNOSIS — M5417 Radiculopathy, lumbosacral region: Secondary | ICD-10-CM | POA: Diagnosis not present

## 2017-09-04 DIAGNOSIS — Z716 Tobacco abuse counseling: Secondary | ICD-10-CM | POA: Diagnosis not present

## 2017-09-04 DIAGNOSIS — M5441 Lumbago with sciatica, right side: Secondary | ICD-10-CM | POA: Diagnosis not present

## 2017-09-11 DIAGNOSIS — M5417 Radiculopathy, lumbosacral region: Secondary | ICD-10-CM | POA: Diagnosis not present

## 2017-09-11 DIAGNOSIS — G894 Chronic pain syndrome: Secondary | ICD-10-CM | POA: Diagnosis not present

## 2017-09-11 DIAGNOSIS — I10 Essential (primary) hypertension: Secondary | ICD-10-CM | POA: Diagnosis not present

## 2017-09-11 DIAGNOSIS — K219 Gastro-esophageal reflux disease without esophagitis: Secondary | ICD-10-CM | POA: Diagnosis not present

## 2017-09-11 DIAGNOSIS — Z880 Allergy status to penicillin: Secondary | ICD-10-CM | POA: Diagnosis not present

## 2017-09-11 DIAGNOSIS — M5441 Lumbago with sciatica, right side: Secondary | ICD-10-CM | POA: Diagnosis not present

## 2017-09-11 DIAGNOSIS — F17200 Nicotine dependence, unspecified, uncomplicated: Secondary | ICD-10-CM | POA: Diagnosis not present

## 2017-10-09 DIAGNOSIS — Z79899 Other long term (current) drug therapy: Secondary | ICD-10-CM | POA: Diagnosis not present

## 2017-10-09 DIAGNOSIS — M5417 Radiculopathy, lumbosacral region: Secondary | ICD-10-CM | POA: Diagnosis not present

## 2017-10-09 DIAGNOSIS — M549 Dorsalgia, unspecified: Secondary | ICD-10-CM | POA: Diagnosis not present

## 2017-10-09 DIAGNOSIS — Z5181 Encounter for therapeutic drug level monitoring: Secondary | ICD-10-CM | POA: Diagnosis not present

## 2017-10-11 DIAGNOSIS — H25813 Combined forms of age-related cataract, bilateral: Secondary | ICD-10-CM | POA: Diagnosis not present

## 2017-10-11 DIAGNOSIS — H35362 Drusen (degenerative) of macula, left eye: Secondary | ICD-10-CM | POA: Diagnosis not present

## 2017-11-27 DIAGNOSIS — J40 Bronchitis, not specified as acute or chronic: Secondary | ICD-10-CM | POA: Diagnosis not present

## 2017-12-06 DIAGNOSIS — M549 Dorsalgia, unspecified: Secondary | ICD-10-CM | POA: Diagnosis not present

## 2017-12-06 DIAGNOSIS — Z79899 Other long term (current) drug therapy: Secondary | ICD-10-CM | POA: Diagnosis not present

## 2017-12-06 DIAGNOSIS — M5417 Radiculopathy, lumbosacral region: Secondary | ICD-10-CM | POA: Diagnosis not present

## 2017-12-06 DIAGNOSIS — M961 Postlaminectomy syndrome, not elsewhere classified: Secondary | ICD-10-CM | POA: Diagnosis not present

## 2018-01-31 DIAGNOSIS — M5417 Radiculopathy, lumbosacral region: Secondary | ICD-10-CM | POA: Diagnosis not present

## 2018-01-31 DIAGNOSIS — Z79899 Other long term (current) drug therapy: Secondary | ICD-10-CM | POA: Diagnosis not present

## 2018-01-31 DIAGNOSIS — M549 Dorsalgia, unspecified: Secondary | ICD-10-CM | POA: Diagnosis not present

## 2018-01-31 DIAGNOSIS — M961 Postlaminectomy syndrome, not elsewhere classified: Secondary | ICD-10-CM | POA: Diagnosis not present

## 2018-02-05 IMAGING — MR MR LUMBAR SPINE WO/W CM
4 of 9 series · 17 of 48 positions shown · IV contrast (multihance)
Comparison: Comparison made with previous MRI from 12/21/2016.

CLINICAL DATA: Initial evaluation for acute back pain with right
lower extremity pain and increasing wound drainage. Recent
thoracolumbar decompression with fusion.

EXAM:
MRI LUMBAR SPINE WITHOUT AND WITH CONTRAST
TECHNIQUE: Multiplanar and multiecho pulse sequences of the lumbar spine were
obtained without and with intravenous contrast.
CONTRAST:  20mL MULTIHANCE GADOBENATE DIMEGLUMINE 529 MG/ML IV SOLN

[Series 4: T2 · sagittal · 4.0mm · 0.55mm/px · 2 of 13 slices shown (1 of 2)]
[im 1/13]
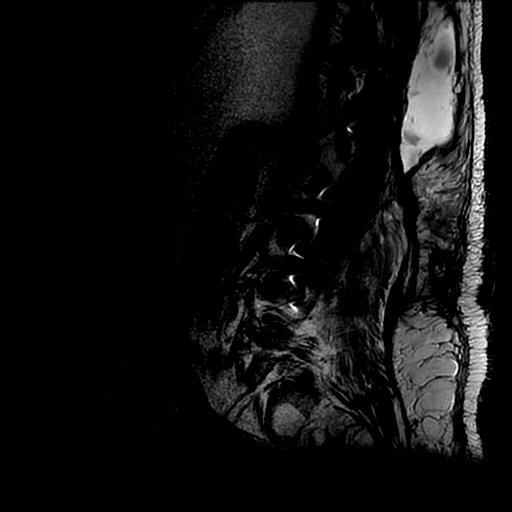
[im 13/13]
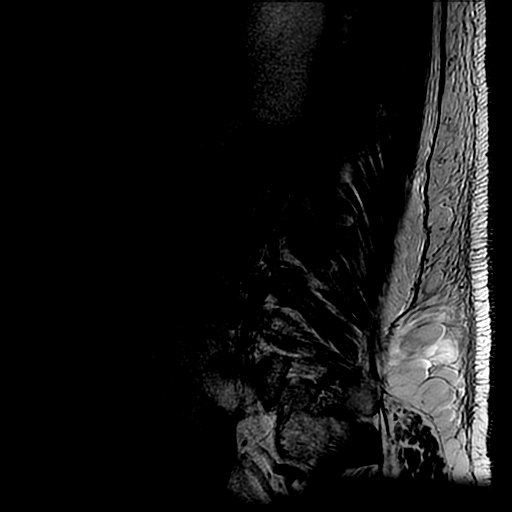

[Series 5: T1 · sagittal · 4.0mm · 0.55mm/px · 3 of 13 slices shown (1 of 2)]
[im 1/13]
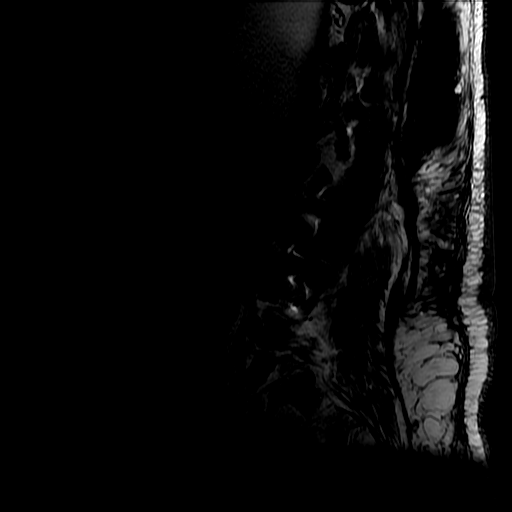
[im 7/13]
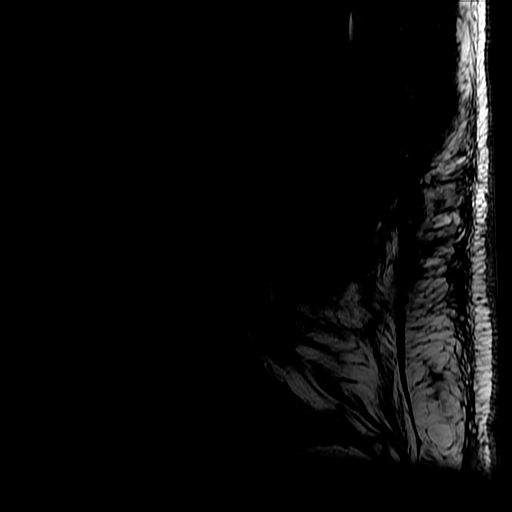
[im 13/13]
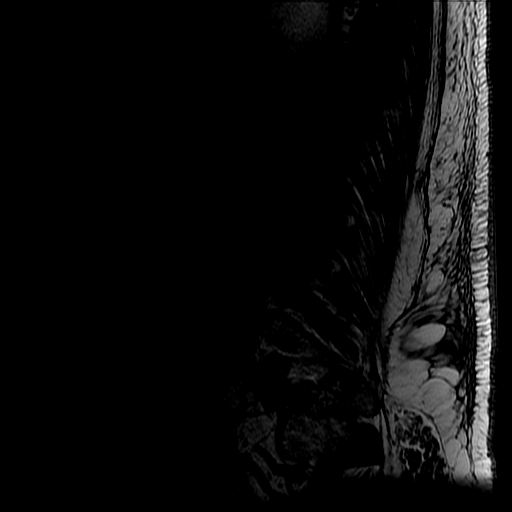

[Series 7: T2 · axial · 4.0mm · 0.39mm/px · z∈[-167,+112]mm · 8 of 50 slices shown (2 of 2)]
[im 1/50]
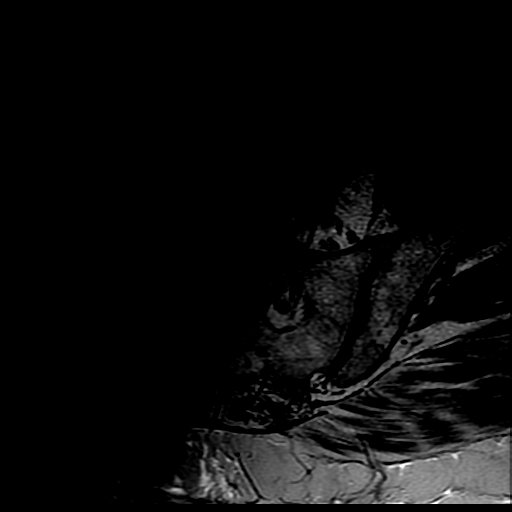
[im 6/50]
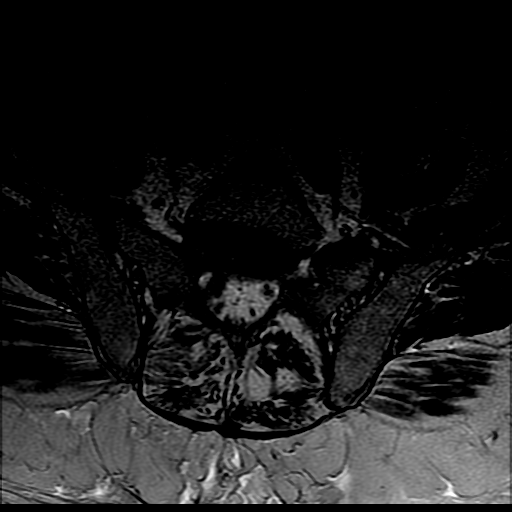
[im 17/50]
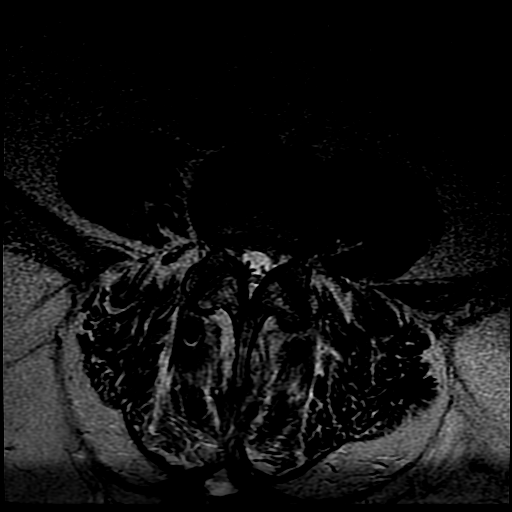
[im 22/50]
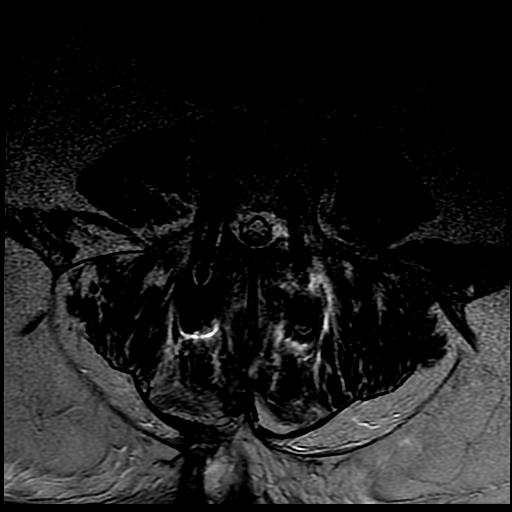
[im 28/50]
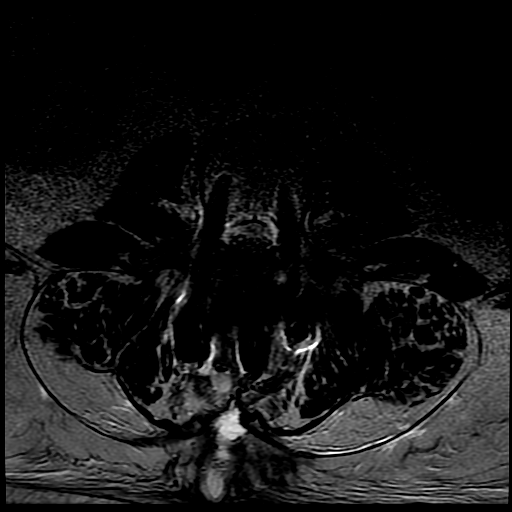
[im 33/50]
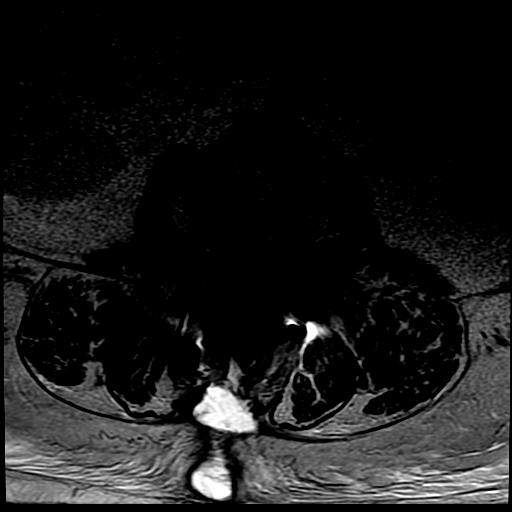
[im 44/50]
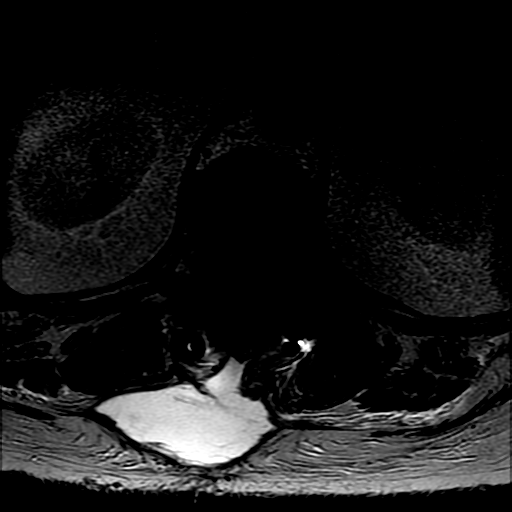
[im 50/50]
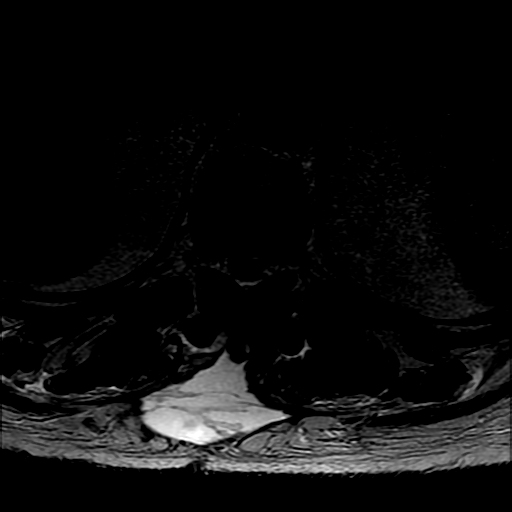

[Series 8: T1 · axial · 4.0mm · 0.39mm/px · z∈[-167,+83]mm · 4 of 50 slices shown (2 of 2)]
[im 1/50]
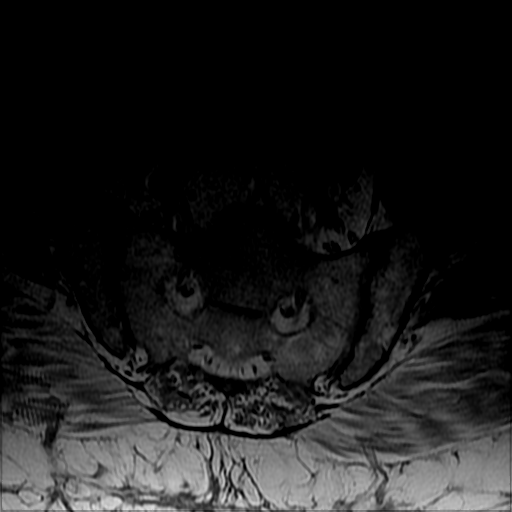
[im 6/50]
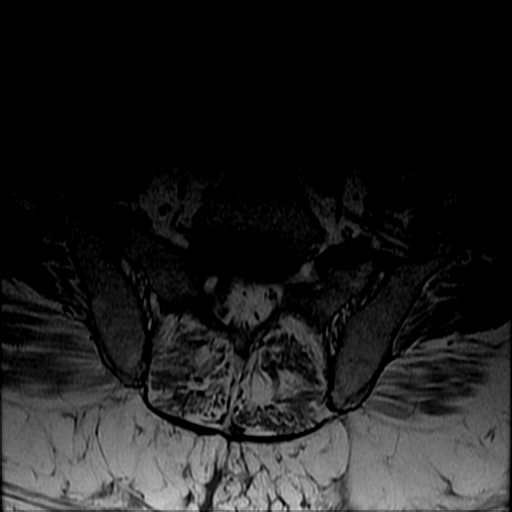
[im 28/50]
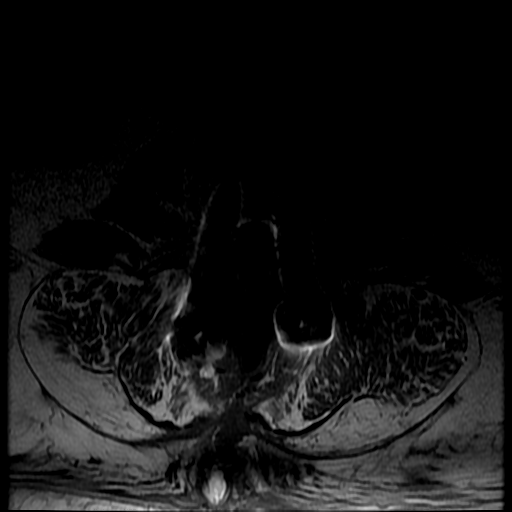
[im 44/50]
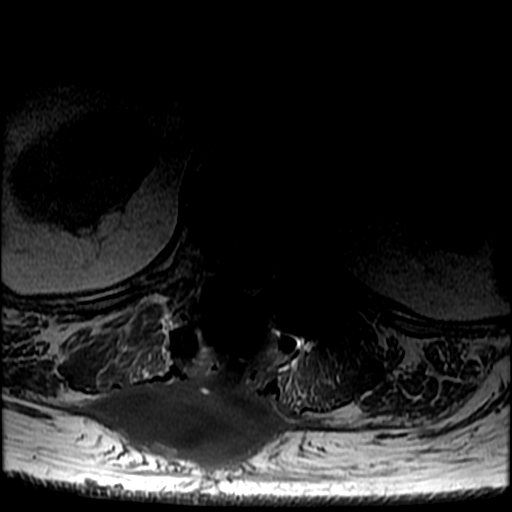

[17 of 48 positions shown; findings below may reference images not displayed]

FINDINGS: Segmentation: Normal segmentation. Lowest well-formed disc labeled
the L5-S1 level. Same numbering system is employed as on previous
exam.

Alignment: Exaggeration of the normal lumbar lordosis, stable. No
interval listhesis or malalignment.

Vertebrae: Previously seen burst fracture involving the L1 vertebral
body again seen with approximate 75% height loss and 5 mm bony
retropulsion, relatively unchanged from previous. Postoperative
changes from interval thoracolumbar fusion extending from T11
through L3, stable. No evidence for acute or interval fracture. No
findings to suggest acute osteomyelitis discitis. Signal intensity
within the vertebral body bone marrow within normal limits. No
discrete or worrisome osseous lesions.

Conus medullaris: Extends to the T12 level and appears normal. No
epidural abscess or other collection.

Paraspinal and other soft tissues: Postoperative changes from recent
thoracolumbar fusion present within the posterior paraspinous soft
tissues. There is a fairly sizable T2 hyperintense collection
visualized within the mid and right lower back in the region of the
midline incision. Visualized portions of the collection measure
x 7.2 x 14.7 cm, although please note that the superior aspect of
this collection is out of the field of view on this exam. Collection
positioned within the subcutaneous fat and along the underlying
fascia are, with subfascial extension to the underlying spinous
processes of T11 through L1 no deeper extension to the thecal sac.
Collection demonstrates peripheral rim enhancement. While this
collection may reflect a benign postoperative seroma, superimposed
infection could be considered in the correct clinical setting.

Remainder the visualized paraspinous soft tissues within normal
limits. Small T2 hyperintense right renal cyst noted. Visualized
visceral structures otherwise unremarkable.

Disc levels:

T11-12:

Mild disc bulge with small central disc protrusion minimally indents
the ventral thecal sac. No stenosis.

T11-12:  Mild disc bulge.  No stenosis.

T12-L1: Mild diffuse disc bulge with disc desiccation. Similar 5 mm
bony retropulsion of the posterosuperior corner of L1. Similar mild
canal stenosis. Grossly stable mild left foraminal narrowing.

L1-2:  Normal interspace.  No canal or foraminal stenosis.

L2-3: Degenerative disc bulge with disc desiccation. Mild facet
hypertrophy. No significant canal stenosis. Mild left foraminal
stenosis.

L3-4: Diffuse disc bulge with disc desiccation. Moderate facet
hypertrophy. Stable mild lateral recess stenosis without significant
canal narrowing. Mild to moderate bilateral foraminal stenosis,
unchanged.

L4-5: Diffuse disc bulge with disc desiccation and intervertebral
disc space narrowing. Sequelae of prior decompressive laminectomy.
Stable facet hypertrophy. Moderate right lateral recess stenosis,
stable. Mild to moderate right with mild left L4 foraminal
narrowing, unchanged.

L5-S1: Broad posterior disc protrusion, eccentric to the right.
Sequelae of prior decompression. Stable facet arthrosis. Moderate
right lateral recess stenosis, unchanged. Mild right foraminal
stenosis, also stable.
IMPRESSION: 1. Postoperative changes from recent thoracolumbar fusion at T11
through L3 for L1 compression fracture.
2. 4.5 x 7.2 x 14.7 cm collection within the soft tissues of the
lower back along the postoperative incision as above. While this may
reflect a benign postoperative collection/seroma, superimposed
infection could be considered in the correct clinical setting.
3. No other evidence for acute infection within the lumbar spine. No
epidural abscess or evidence for osteomyelitis discitis.
4. Multilevel degenerative spondylolysis as above, stable from
previous.

## 2018-03-21 DIAGNOSIS — F1190 Opioid use, unspecified, uncomplicated: Secondary | ICD-10-CM | POA: Diagnosis not present

## 2018-03-21 DIAGNOSIS — M5417 Radiculopathy, lumbosacral region: Secondary | ICD-10-CM | POA: Diagnosis not present

## 2018-03-21 DIAGNOSIS — M549 Dorsalgia, unspecified: Secondary | ICD-10-CM | POA: Diagnosis not present

## 2018-03-21 DIAGNOSIS — M961 Postlaminectomy syndrome, not elsewhere classified: Secondary | ICD-10-CM | POA: Diagnosis not present

## 2018-04-17 DIAGNOSIS — S32010A Wedge compression fracture of first lumbar vertebra, initial encounter for closed fracture: Secondary | ICD-10-CM | POA: Diagnosis not present

## 2018-04-17 DIAGNOSIS — Z6841 Body Mass Index (BMI) 40.0 and over, adult: Secondary | ICD-10-CM | POA: Diagnosis not present

## 2018-04-17 DIAGNOSIS — I10 Essential (primary) hypertension: Secondary | ICD-10-CM | POA: Diagnosis not present

## 2018-05-02 DIAGNOSIS — M5126 Other intervertebral disc displacement, lumbar region: Secondary | ICD-10-CM | POA: Diagnosis not present

## 2018-05-02 DIAGNOSIS — X58XXXD Exposure to other specified factors, subsequent encounter: Secondary | ICD-10-CM | POA: Diagnosis not present

## 2018-05-02 DIAGNOSIS — S32020D Wedge compression fracture of second lumbar vertebra, subsequent encounter for fracture with routine healing: Secondary | ICD-10-CM | POA: Diagnosis not present

## 2018-05-02 DIAGNOSIS — S32010A Wedge compression fracture of first lumbar vertebra, initial encounter for closed fracture: Secondary | ICD-10-CM | POA: Diagnosis not present

## 2018-05-07 DIAGNOSIS — S32010A Wedge compression fracture of first lumbar vertebra, initial encounter for closed fracture: Secondary | ICD-10-CM | POA: Diagnosis not present

## 2018-05-07 DIAGNOSIS — M5417 Radiculopathy, lumbosacral region: Secondary | ICD-10-CM | POA: Diagnosis not present

## 2018-05-17 DIAGNOSIS — Z5181 Encounter for therapeutic drug level monitoring: Secondary | ICD-10-CM | POA: Diagnosis not present

## 2018-05-17 DIAGNOSIS — M5417 Radiculopathy, lumbosacral region: Secondary | ICD-10-CM | POA: Diagnosis not present

## 2018-05-17 DIAGNOSIS — M549 Dorsalgia, unspecified: Secondary | ICD-10-CM | POA: Diagnosis not present

## 2018-05-17 DIAGNOSIS — Z79899 Other long term (current) drug therapy: Secondary | ICD-10-CM | POA: Diagnosis not present

## 2018-07-12 DIAGNOSIS — Z79899 Other long term (current) drug therapy: Secondary | ICD-10-CM | POA: Diagnosis not present

## 2018-07-12 DIAGNOSIS — M549 Dorsalgia, unspecified: Secondary | ICD-10-CM | POA: Diagnosis not present

## 2018-07-12 DIAGNOSIS — Z716 Tobacco abuse counseling: Secondary | ICD-10-CM | POA: Diagnosis not present

## 2018-07-12 DIAGNOSIS — G894 Chronic pain syndrome: Secondary | ICD-10-CM | POA: Diagnosis not present

## 2018-08-16 DIAGNOSIS — R7309 Other abnormal glucose: Secondary | ICD-10-CM | POA: Diagnosis not present

## 2018-08-16 DIAGNOSIS — Z125 Encounter for screening for malignant neoplasm of prostate: Secondary | ICD-10-CM | POA: Diagnosis not present

## 2018-08-16 DIAGNOSIS — E782 Mixed hyperlipidemia: Secondary | ICD-10-CM | POA: Diagnosis not present

## 2018-08-16 DIAGNOSIS — K210 Gastro-esophageal reflux disease with esophagitis: Secondary | ICD-10-CM | POA: Diagnosis not present

## 2018-08-16 DIAGNOSIS — I10 Essential (primary) hypertension: Secondary | ICD-10-CM | POA: Diagnosis not present

## 2018-08-16 DIAGNOSIS — Z0001 Encounter for general adult medical examination with abnormal findings: Secondary | ICD-10-CM | POA: Diagnosis not present

## 2018-09-06 DIAGNOSIS — M961 Postlaminectomy syndrome, not elsewhere classified: Secondary | ICD-10-CM | POA: Diagnosis not present

## 2018-09-06 DIAGNOSIS — Z716 Tobacco abuse counseling: Secondary | ICD-10-CM | POA: Diagnosis not present

## 2018-09-06 DIAGNOSIS — F1190 Opioid use, unspecified, uncomplicated: Secondary | ICD-10-CM | POA: Diagnosis not present

## 2018-09-06 DIAGNOSIS — M549 Dorsalgia, unspecified: Secondary | ICD-10-CM | POA: Diagnosis not present

## 2018-09-14 DIAGNOSIS — Z13810 Encounter for screening for upper gastrointestinal disorder: Secondary | ICD-10-CM | POA: Diagnosis not present

## 2018-09-14 DIAGNOSIS — K22711 Barrett's esophagus with high grade dysplasia: Secondary | ICD-10-CM | POA: Diagnosis not present

## 2018-09-14 DIAGNOSIS — K2270 Barrett's esophagus without dysplasia: Secondary | ICD-10-CM | POA: Diagnosis not present

## 2018-09-14 DIAGNOSIS — K219 Gastro-esophageal reflux disease without esophagitis: Secondary | ICD-10-CM | POA: Diagnosis not present

## 2018-09-14 DIAGNOSIS — Z8719 Personal history of other diseases of the digestive system: Secondary | ICD-10-CM | POA: Diagnosis not present

## 2018-09-14 DIAGNOSIS — K449 Diaphragmatic hernia without obstruction or gangrene: Secondary | ICD-10-CM | POA: Diagnosis not present

## 2018-10-25 DIAGNOSIS — Z9889 Other specified postprocedural states: Secondary | ICD-10-CM | POA: Diagnosis not present

## 2018-10-25 DIAGNOSIS — K219 Gastro-esophageal reflux disease without esophagitis: Secondary | ICD-10-CM | POA: Diagnosis not present

## 2018-10-25 DIAGNOSIS — K22711 Barrett's esophagus with high grade dysplasia: Secondary | ICD-10-CM | POA: Diagnosis not present

## 2018-11-01 DIAGNOSIS — Z716 Tobacco abuse counseling: Secondary | ICD-10-CM | POA: Diagnosis not present

## 2018-11-01 DIAGNOSIS — F1190 Opioid use, unspecified, uncomplicated: Secondary | ICD-10-CM | POA: Diagnosis not present

## 2018-11-01 DIAGNOSIS — M549 Dorsalgia, unspecified: Secondary | ICD-10-CM | POA: Diagnosis not present

## 2018-11-01 DIAGNOSIS — M961 Postlaminectomy syndrome, not elsewhere classified: Secondary | ICD-10-CM | POA: Diagnosis not present

## 2018-11-23 DIAGNOSIS — Z716 Tobacco abuse counseling: Secondary | ICD-10-CM | POA: Diagnosis not present

## 2018-11-23 DIAGNOSIS — M5417 Radiculopathy, lumbosacral region: Secondary | ICD-10-CM | POA: Diagnosis not present

## 2018-11-23 DIAGNOSIS — M961 Postlaminectomy syndrome, not elsewhere classified: Secondary | ICD-10-CM | POA: Diagnosis not present

## 2018-11-23 DIAGNOSIS — M5441 Lumbago with sciatica, right side: Secondary | ICD-10-CM | POA: Diagnosis not present

## 2018-12-26 DIAGNOSIS — G894 Chronic pain syndrome: Secondary | ICD-10-CM | POA: Diagnosis not present

## 2018-12-26 DIAGNOSIS — Z79899 Other long term (current) drug therapy: Secondary | ICD-10-CM | POA: Diagnosis not present

## 2018-12-26 DIAGNOSIS — M961 Postlaminectomy syndrome, not elsewhere classified: Secondary | ICD-10-CM | POA: Diagnosis not present

## 2018-12-26 DIAGNOSIS — Z716 Tobacco abuse counseling: Secondary | ICD-10-CM | POA: Diagnosis not present

## 2019-01-23 DIAGNOSIS — M5441 Lumbago with sciatica, right side: Secondary | ICD-10-CM | POA: Diagnosis not present

## 2019-01-23 DIAGNOSIS — G8929 Other chronic pain: Secondary | ICD-10-CM | POA: Diagnosis not present

## 2019-01-23 DIAGNOSIS — M961 Postlaminectomy syndrome, not elsewhere classified: Secondary | ICD-10-CM | POA: Diagnosis not present

## 2019-01-23 DIAGNOSIS — M5417 Radiculopathy, lumbosacral region: Secondary | ICD-10-CM | POA: Diagnosis not present

## 2019-01-31 DIAGNOSIS — F17200 Nicotine dependence, unspecified, uncomplicated: Secondary | ICD-10-CM | POA: Diagnosis not present

## 2019-01-31 DIAGNOSIS — M5441 Lumbago with sciatica, right side: Secondary | ICD-10-CM | POA: Diagnosis not present

## 2019-01-31 DIAGNOSIS — G894 Chronic pain syndrome: Secondary | ICD-10-CM | POA: Diagnosis not present

## 2019-01-31 DIAGNOSIS — Z79899 Other long term (current) drug therapy: Secondary | ICD-10-CM | POA: Diagnosis not present

## 2019-01-31 DIAGNOSIS — M5417 Radiculopathy, lumbosacral region: Secondary | ICD-10-CM | POA: Diagnosis not present

## 2019-01-31 DIAGNOSIS — Z888 Allergy status to other drugs, medicaments and biological substances status: Secondary | ICD-10-CM | POA: Diagnosis not present

## 2019-01-31 DIAGNOSIS — I10 Essential (primary) hypertension: Secondary | ICD-10-CM | POA: Diagnosis not present

## 2019-01-31 DIAGNOSIS — K219 Gastro-esophageal reflux disease without esophagitis: Secondary | ICD-10-CM | POA: Diagnosis not present

## 2019-02-26 DIAGNOSIS — Z20828 Contact with and (suspected) exposure to other viral communicable diseases: Secondary | ICD-10-CM | POA: Diagnosis not present

## 2019-02-26 DIAGNOSIS — K22711 Barrett's esophagus with high grade dysplasia: Secondary | ICD-10-CM | POA: Diagnosis not present

## 2019-02-26 DIAGNOSIS — Z01812 Encounter for preprocedural laboratory examination: Secondary | ICD-10-CM | POA: Diagnosis not present

## 2019-02-26 DIAGNOSIS — Z1159 Encounter for screening for other viral diseases: Secondary | ICD-10-CM | POA: Diagnosis not present

## 2019-03-01 DIAGNOSIS — M549 Dorsalgia, unspecified: Secondary | ICD-10-CM | POA: Diagnosis not present

## 2019-03-01 DIAGNOSIS — M5417 Radiculopathy, lumbosacral region: Secondary | ICD-10-CM | POA: Diagnosis not present

## 2019-03-01 DIAGNOSIS — Z716 Tobacco abuse counseling: Secondary | ICD-10-CM | POA: Diagnosis not present

## 2019-03-01 DIAGNOSIS — G894 Chronic pain syndrome: Secondary | ICD-10-CM | POA: Diagnosis not present

## 2019-03-05 DIAGNOSIS — K22711 Barrett's esophagus with high grade dysplasia: Secondary | ICD-10-CM | POA: Diagnosis not present

## 2019-04-25 DIAGNOSIS — H903 Sensorineural hearing loss, bilateral: Secondary | ICD-10-CM | POA: Diagnosis not present

## 2019-04-25 DIAGNOSIS — Z23 Encounter for immunization: Secondary | ICD-10-CM | POA: Diagnosis not present

## 2019-04-25 DIAGNOSIS — H9319 Tinnitus, unspecified ear: Secondary | ICD-10-CM | POA: Diagnosis not present

## 2019-04-25 DIAGNOSIS — Z77122 Contact with and (suspected) exposure to noise: Secondary | ICD-10-CM | POA: Diagnosis not present

## 2019-04-25 DIAGNOSIS — H9190 Unspecified hearing loss, unspecified ear: Secondary | ICD-10-CM | POA: Diagnosis not present

## 2019-04-30 DIAGNOSIS — G894 Chronic pain syndrome: Secondary | ICD-10-CM | POA: Diagnosis not present

## 2019-04-30 DIAGNOSIS — Z79899 Other long term (current) drug therapy: Secondary | ICD-10-CM | POA: Diagnosis not present

## 2019-04-30 DIAGNOSIS — M961 Postlaminectomy syndrome, not elsewhere classified: Secondary | ICD-10-CM | POA: Diagnosis not present

## 2019-04-30 DIAGNOSIS — M549 Dorsalgia, unspecified: Secondary | ICD-10-CM | POA: Diagnosis not present

## 2019-06-25 DIAGNOSIS — M961 Postlaminectomy syndrome, not elsewhere classified: Secondary | ICD-10-CM | POA: Diagnosis not present

## 2019-06-25 DIAGNOSIS — M5417 Radiculopathy, lumbosacral region: Secondary | ICD-10-CM | POA: Diagnosis not present

## 2019-06-25 DIAGNOSIS — M5441 Lumbago with sciatica, right side: Secondary | ICD-10-CM | POA: Diagnosis not present

## 2019-06-25 DIAGNOSIS — S32000S Wedge compression fracture of unspecified lumbar vertebra, sequela: Secondary | ICD-10-CM | POA: Diagnosis not present

## 2019-08-20 DIAGNOSIS — M961 Postlaminectomy syndrome, not elsewhere classified: Secondary | ICD-10-CM | POA: Diagnosis not present

## 2019-08-20 DIAGNOSIS — M5441 Lumbago with sciatica, right side: Secondary | ICD-10-CM | POA: Diagnosis not present

## 2019-08-20 DIAGNOSIS — M549 Dorsalgia, unspecified: Secondary | ICD-10-CM | POA: Diagnosis not present

## 2019-08-20 DIAGNOSIS — M5417 Radiculopathy, lumbosacral region: Secondary | ICD-10-CM | POA: Diagnosis not present

## 2019-08-27 DIAGNOSIS — Z125 Encounter for screening for malignant neoplasm of prostate: Secondary | ICD-10-CM | POA: Diagnosis not present

## 2019-08-27 DIAGNOSIS — K2100 Gastro-esophageal reflux disease with esophagitis, without bleeding: Secondary | ICD-10-CM | POA: Diagnosis not present

## 2019-08-27 DIAGNOSIS — R0602 Shortness of breath: Secondary | ICD-10-CM | POA: Diagnosis not present

## 2019-08-27 DIAGNOSIS — E782 Mixed hyperlipidemia: Secondary | ICD-10-CM | POA: Diagnosis not present

## 2019-08-27 DIAGNOSIS — R7309 Other abnormal glucose: Secondary | ICD-10-CM | POA: Diagnosis not present

## 2019-08-27 DIAGNOSIS — I10 Essential (primary) hypertension: Secondary | ICD-10-CM | POA: Diagnosis not present

## 2019-08-27 DIAGNOSIS — Z0000 Encounter for general adult medical examination without abnormal findings: Secondary | ICD-10-CM | POA: Diagnosis not present

## 2019-09-16 DIAGNOSIS — Z01812 Encounter for preprocedural laboratory examination: Secondary | ICD-10-CM | POA: Diagnosis not present

## 2019-09-16 DIAGNOSIS — Z20822 Contact with and (suspected) exposure to covid-19: Secondary | ICD-10-CM | POA: Diagnosis not present

## 2019-09-16 DIAGNOSIS — K22711 Barrett's esophagus with high grade dysplasia: Secondary | ICD-10-CM | POA: Diagnosis not present

## 2019-09-23 DIAGNOSIS — K2270 Barrett's esophagus without dysplasia: Secondary | ICD-10-CM | POA: Diagnosis not present

## 2019-09-23 DIAGNOSIS — K208 Other esophagitis without bleeding: Secondary | ICD-10-CM | POA: Diagnosis not present

## 2019-09-23 DIAGNOSIS — K22711 Barrett's esophagus with high grade dysplasia: Secondary | ICD-10-CM | POA: Diagnosis not present

## 2019-09-23 DIAGNOSIS — K529 Noninfective gastroenteritis and colitis, unspecified: Secondary | ICD-10-CM | POA: Diagnosis not present

## 2019-09-23 DIAGNOSIS — Z8719 Personal history of other diseases of the digestive system: Secondary | ICD-10-CM | POA: Diagnosis not present

## 2019-09-23 DIAGNOSIS — K449 Diaphragmatic hernia without obstruction or gangrene: Secondary | ICD-10-CM | POA: Diagnosis not present

## 2019-12-17 DIAGNOSIS — Z79899 Other long term (current) drug therapy: Secondary | ICD-10-CM | POA: Diagnosis not present

## 2019-12-17 DIAGNOSIS — G894 Chronic pain syndrome: Secondary | ICD-10-CM | POA: Diagnosis not present

## 2019-12-17 DIAGNOSIS — M549 Dorsalgia, unspecified: Secondary | ICD-10-CM | POA: Diagnosis not present

## 2019-12-17 DIAGNOSIS — M961 Postlaminectomy syndrome, not elsewhere classified: Secondary | ICD-10-CM | POA: Diagnosis not present

## 2019-12-31 DIAGNOSIS — Z8719 Personal history of other diseases of the digestive system: Secondary | ICD-10-CM | POA: Diagnosis not present

## 2019-12-31 DIAGNOSIS — K2270 Barrett's esophagus without dysplasia: Secondary | ICD-10-CM | POA: Diagnosis not present

## 2019-12-31 DIAGNOSIS — K22711 Barrett's esophagus with high grade dysplasia: Secondary | ICD-10-CM | POA: Diagnosis not present

## 2019-12-31 DIAGNOSIS — Z9889 Other specified postprocedural states: Secondary | ICD-10-CM | POA: Diagnosis not present

## 2020-02-11 DIAGNOSIS — Z716 Tobacco abuse counseling: Secondary | ICD-10-CM | POA: Diagnosis not present

## 2020-02-11 DIAGNOSIS — M5417 Radiculopathy, lumbosacral region: Secondary | ICD-10-CM | POA: Diagnosis not present

## 2020-02-11 DIAGNOSIS — M961 Postlaminectomy syndrome, not elsewhere classified: Secondary | ICD-10-CM | POA: Diagnosis not present

## 2020-02-11 DIAGNOSIS — G894 Chronic pain syndrome: Secondary | ICD-10-CM | POA: Diagnosis not present

## 2021-01-23 DIAGNOSIS — I361 Nonrheumatic tricuspid (valve) insufficiency: Secondary | ICD-10-CM

## 2021-01-30 DIAGNOSIS — I493 Ventricular premature depolarization: Secondary | ICD-10-CM

## 2021-02-01 DIAGNOSIS — J9690 Respiratory failure, unspecified, unspecified whether with hypoxia or hypercapnia: Secondary | ICD-10-CM | POA: Diagnosis not present

## 2021-02-02 DIAGNOSIS — J9690 Respiratory failure, unspecified, unspecified whether with hypoxia or hypercapnia: Secondary | ICD-10-CM | POA: Diagnosis not present

## 2022-07-23 DIAGNOSIS — A419 Sepsis, unspecified organism: Secondary | ICD-10-CM

## 2022-07-23 DIAGNOSIS — R7989 Other specified abnormal findings of blood chemistry: Secondary | ICD-10-CM

## 2023-01-09 DEATH — deceased
